# Patient Record
Sex: Female | Born: 1980
Health system: Southern US, Community
[De-identification: ages and names within clinical notes are randomized; demographics above are authoritative.]

## PROBLEM LIST (undated history)

## (undated) DIAGNOSIS — K219 Gastro-esophageal reflux disease without esophagitis: Secondary | ICD-10-CM

## (undated) DIAGNOSIS — F988 Other specified behavioral and emotional disorders with onset usually occurring in childhood and adolescence: Secondary | ICD-10-CM

## (undated) HISTORY — DX: Gastro-esophageal reflux disease without esophagitis: K21.9

## (undated) HISTORY — DX: Other specified behavioral and emotional disorders with onset usually occurring in childhood and adolescence: F98.8

---
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
## 1999-09-25 ENCOUNTER — Encounter: Payer: Self-pay | Admitting: Family Medicine

## 1999-09-25 ENCOUNTER — Ambulatory Visit (HOSPITAL_COMMUNITY): Admission: RE | Admit: 1999-09-25 | Discharge: 1999-09-25 | Payer: Self-pay | Admitting: Family Medicine

## 2000-01-15 ENCOUNTER — Other Ambulatory Visit: Admission: RE | Admit: 2000-01-15 | Discharge: 2000-01-15 | Payer: Self-pay | Admitting: Gastroenterology

## 2000-01-15 ENCOUNTER — Encounter (INDEPENDENT_AMBULATORY_CARE_PROVIDER_SITE_OTHER): Payer: Self-pay | Admitting: Specialist

## 2001-11-27 ENCOUNTER — Encounter: Payer: Self-pay | Admitting: Emergency Medicine

## 2001-11-27 ENCOUNTER — Emergency Department (HOSPITAL_COMMUNITY): Admission: EM | Admit: 2001-11-27 | Discharge: 2001-11-27 | Payer: Self-pay | Admitting: Emergency Medicine

## 2004-03-04 ENCOUNTER — Ambulatory Visit: Payer: Self-pay | Admitting: Family Medicine

## 2004-10-26 ENCOUNTER — Ambulatory Visit: Payer: Self-pay | Admitting: Family Medicine

## 2005-05-25 ENCOUNTER — Ambulatory Visit: Payer: Self-pay | Admitting: Family Medicine

## 2006-02-18 ENCOUNTER — Ambulatory Visit: Payer: Self-pay | Admitting: Family Medicine

## 2006-07-15 DIAGNOSIS — F988 Other specified behavioral and emotional disorders with onset usually occurring in childhood and adolescence: Secondary | ICD-10-CM | POA: Insufficient documentation

## 2006-08-02 ENCOUNTER — Encounter: Payer: Self-pay | Admitting: Family Medicine

## 2006-11-23 ENCOUNTER — Telehealth (INDEPENDENT_AMBULATORY_CARE_PROVIDER_SITE_OTHER): Payer: Self-pay | Admitting: *Deleted

## 2006-12-16 ENCOUNTER — Ambulatory Visit: Payer: Self-pay | Admitting: Family Medicine

## 2006-12-16 DIAGNOSIS — J029 Acute pharyngitis, unspecified: Secondary | ICD-10-CM | POA: Insufficient documentation

## 2006-12-16 LAB — CONVERTED CEMR LAB: Rapid Strep: NEGATIVE

## 2006-12-20 ENCOUNTER — Telehealth (INDEPENDENT_AMBULATORY_CARE_PROVIDER_SITE_OTHER): Payer: Self-pay | Admitting: *Deleted

## 2007-02-10 ENCOUNTER — Telehealth (INDEPENDENT_AMBULATORY_CARE_PROVIDER_SITE_OTHER): Payer: Self-pay | Admitting: *Deleted

## 2007-03-27 ENCOUNTER — Telehealth (INDEPENDENT_AMBULATORY_CARE_PROVIDER_SITE_OTHER): Payer: Self-pay | Admitting: *Deleted

## 2007-04-13 ENCOUNTER — Telehealth (INDEPENDENT_AMBULATORY_CARE_PROVIDER_SITE_OTHER): Payer: Self-pay | Admitting: *Deleted

## 2007-04-13 ENCOUNTER — Encounter: Payer: Self-pay | Admitting: Family Medicine

## 2007-05-29 ENCOUNTER — Encounter: Payer: Self-pay | Admitting: Family Medicine

## 2007-05-29 ENCOUNTER — Telehealth (INDEPENDENT_AMBULATORY_CARE_PROVIDER_SITE_OTHER): Payer: Self-pay | Admitting: *Deleted

## 2007-11-15 ENCOUNTER — Telehealth (INDEPENDENT_AMBULATORY_CARE_PROVIDER_SITE_OTHER): Payer: Self-pay | Admitting: *Deleted

## 2007-12-29 ENCOUNTER — Ambulatory Visit: Payer: Self-pay | Admitting: Family Medicine

## 2007-12-29 DIAGNOSIS — K219 Gastro-esophageal reflux disease without esophagitis: Secondary | ICD-10-CM

## 2008-05-07 ENCOUNTER — Telehealth (INDEPENDENT_AMBULATORY_CARE_PROVIDER_SITE_OTHER): Payer: Self-pay | Admitting: *Deleted

## 2008-06-27 ENCOUNTER — Telehealth (INDEPENDENT_AMBULATORY_CARE_PROVIDER_SITE_OTHER): Payer: Self-pay | Admitting: *Deleted

## 2008-06-28 ENCOUNTER — Encounter: Payer: Self-pay | Admitting: Family Medicine

## 2009-02-05 ENCOUNTER — Telehealth: Payer: Self-pay | Admitting: Family Medicine

## 2010-07-01 ENCOUNTER — Encounter: Payer: Self-pay | Admitting: Family Medicine

## 2010-07-02 ENCOUNTER — Ambulatory Visit (INDEPENDENT_AMBULATORY_CARE_PROVIDER_SITE_OTHER): Payer: PRIVATE HEALTH INSURANCE | Admitting: Family Medicine

## 2010-07-02 ENCOUNTER — Encounter: Payer: Self-pay | Admitting: Family Medicine

## 2010-07-02 VITALS — BP 102/60 | Temp 99.1°F | Wt 139.4 lb

## 2010-07-02 DIAGNOSIS — L089 Local infection of the skin and subcutaneous tissue, unspecified: Secondary | ICD-10-CM

## 2010-07-02 DIAGNOSIS — R238 Other skin changes: Secondary | ICD-10-CM

## 2010-07-02 DIAGNOSIS — F988 Other specified behavioral and emotional disorders with onset usually occurring in childhood and adolescence: Secondary | ICD-10-CM

## 2010-07-02 DIAGNOSIS — L988 Other specified disorders of the skin and subcutaneous tissue: Secondary | ICD-10-CM

## 2010-07-02 DIAGNOSIS — K219 Gastro-esophageal reflux disease without esophagitis: Secondary | ICD-10-CM

## 2010-07-02 MED ORDER — METHYLPHENIDATE HCL 10 MG PO TABS: 10.0000 mg | ORAL_TABLET | Freq: Two times a day (BID) | ORAL | Status: DC

## 2010-07-02 MED ORDER — MUPIROCIN CALCIUM 2 % NA OINT: TOPICAL_OINTMENT | Freq: Two times a day (BID) | NASAL | Status: AC

## 2010-07-02 MED ORDER — PANTOPRAZOLE SODIUM 40 MG PO TBEC: 40.0000 mg | DELAYED_RELEASE_TABLET | Freq: Every day | ORAL | Status: DC

## 2010-07-02 MED ORDER — MUPIROCIN CALCIUM 2 % NA OINT: TOPICAL_OINTMENT | Freq: Two times a day (BID) | NASAL | Status: DC

## 2010-07-02 NOTE — Assessment & Plan Note (Signed)
Refill ritalin rto 6 months

## 2010-07-02 NOTE — Assessment & Plan Note (Signed)
bactroban nasal Cultures sent

## 2010-07-02 NOTE — Progress Notes (Signed)
  Subjective:    Patient ID: Christina Villarreal, female    DOB: Dec 13, 1980, 30 y.o.   MRN: [id number]  HPI Pt here for refill on meds and c/o sore in L nostril that has been there a while. No other complaints.    Review of Systems As above    Objective:   Physical Exam  Constitutional: She is oriented to person, place, and time. She appears well-developed and well-nourished.  HENT:  Nose:    Cardiovascular: Normal rate, regular rhythm and normal heart sounds.   Pulmonary/Chest: Effort normal and breath sounds normal. No respiratory distress. She has no wheezes.  Neurological: She is alert and oriented to person, place, and time.  Skin: Skin is warm and dry.          Assessment & Plan:

## 2010-07-02 NOTE — Assessment & Plan Note (Signed)
Refill protonix 

## 2010-07-05 LAB — WOUND CULTURE

## 2010-07-08 ENCOUNTER — Telehealth: Payer: Self-pay

## 2010-07-08 MED ORDER — ERYTHROMYCIN BASE 500 MG PO TABS: 500.0000 mg | ORAL_TABLET | Freq: Two times a day (BID) | ORAL | Status: AC

## 2010-07-08 NOTE — Telephone Encounter (Signed)
Message copied by Almeta Monas on Wed Jul 08, 2010  2:30 PM ------      Message from: Loreen Freud      Created: Mon Jul 06, 2010  8:34 AM       + staph---not [place]----  Take erythromycin 500 mg bid for 10 days

## 2010-07-08 NOTE — Telephone Encounter (Signed)
Left message to call back     KP 

## 2010-07-08 NOTE — Telephone Encounter (Signed)
Spoke with patient and made her aware of results and she voiced understanding... Rx sent [facility] [address]    KP

## 2010-07-09 ENCOUNTER — Telehealth: Payer: Self-pay | Admitting: *Deleted

## 2010-07-09 NOTE — Telephone Encounter (Signed)
Pt states that she was Dx with staph infection in her nose on yesterday. Pt now c/o upset stomach x1week that she has now increased since starting antibiotic.  Pt would like to know if this could be related to staph infection that she was Dx with.  Discuss with patient, advise Pt to make sure med is taking with food not on empty stomach if symptoms progress advise Pt to call office for OV Pt ok.

## 2010-08-19 ENCOUNTER — Other Ambulatory Visit: Payer: Self-pay | Admitting: *Deleted

## 2010-08-19 MED ORDER — METHYLPHENIDATE HCL 10 MG PO TABS: 10.0000 mg | ORAL_TABLET | Freq: Two times a day (BID) | ORAL | Status: DC

## 2010-08-19 NOTE — Telephone Encounter (Signed)
Left message to call office

## 2010-08-19 NOTE — Telephone Encounter (Signed)
Pt aware Rx ready and that they may not be filled until date indicated and we will not replace lost Rx.

## 2010-09-15 ENCOUNTER — Ambulatory Visit (INDEPENDENT_AMBULATORY_CARE_PROVIDER_SITE_OTHER): Payer: PRIVATE HEALTH INSURANCE | Admitting: Family Medicine

## 2010-09-15 ENCOUNTER — Encounter: Payer: Self-pay | Admitting: Family Medicine

## 2010-09-15 VITALS — BP 112/60 | HR 125 | Temp 100.2°F | Wt 141.8 lb

## 2010-09-15 DIAGNOSIS — J029 Acute pharyngitis, unspecified: Secondary | ICD-10-CM

## 2010-09-15 MED ORDER — BECLOMETHASONE DIPROPIONATE 80 MCG/ACT NA AERS: 2.0000 | INHALATION_SPRAY | NASAL | Status: DC

## 2010-09-15 MED ORDER — AZELASTINE HCL 0.15 % NA SOLN: NASAL | Status: DC

## 2010-09-15 MED ORDER — AMOXICILLIN 875 MG PO TABS: 875.0000 mg | ORAL_TABLET | Freq: Two times a day (BID) | ORAL | Status: AC

## 2010-09-15 NOTE — Patient Instructions (Signed)
Sore Throat   Sore throats may be caused by bacteria and viruses. They may also be caused by:   Ø Smoking.   Ø Pollution.   Ø Allergies.   If a sore throat is due to Strep infection (a bacterial infection), you may need:   Ø A throat swab and/or   Ø A culture test to verify the strep infection.   You will need one of these:   Ø An antibiotic shot.   Ø Oral medicine for a full 10 days to cure it.   Strep infection is very contagious. A doctor should check any close contacts who have a sore throat or fever. A sore throat caused by a virus infection will usually last only 3-4 days. Antibiotics will not treat a viral sore throat.   Infectious mononucleosis (a viral disease), however, can cause a sore throat that last for up to 3 weeks. Mononucleosis can be diagnosed with blood tests. You must have been sick for at least 1 week for the test to give accurate results.   HOME CARE INSTRUCTIONS   Ø To treat a sore throat, take mild pain medicine.   Ø Increase your fluids.   Ø Eat a soft diet.   Ø Do not smoke.   Ø Gargling with warm water or salt water (1 tsp. salt in 8 oz. water) can be helpful.   Ø Try throat sprays or lozenges or sucking on hard candy will also ease the symptoms.   Call your doctor if your sore throat lasts longer than 1 week.   SEEK IMMEDIATE MEDICAL CARE IF YOU HAVE:   Ø Breathing difficulty.   Ø Increased swelling in the throat.   Ø Pain so severe you are unable to swallow fluids or your saliva.   Ø A severe headache, high fever, vomiting, or red rash.   Document Released: 03/25/2004 Document Re-Released: 05/14/2008   ExitCare® Patient Information ©2011 [employer].

## 2010-09-15 NOTE — Progress Notes (Signed)
  Subjective:     Christina Villarreal is a 30 y.o. female who presents for evaluation of symptoms of a URI. Symptoms include congestion, facial pain, low grade fever, nasal congestion and sore throat. Onset of symptoms was 1 day ago, and has been gradually worsening since that time. Treatment to date: none.  The following portions of the patient's history were reviewed and updated as appropriate: allergies, current medications, past family history, past medical history, past social history, past surgical history and problem list.  Review of Systems Pertinent items are noted in HPI.   Objective:    BP 112/60  Pulse 125  Temp(Src) 100.2 F (37.9 C) (Oral)  Wt 141 lb 12.8 oz (64.32 kg)  SpO2 96% General appearance: alert, cooperative, appears stated age and no distress Head: Normocephalic, without obvious abnormality, atraumatic Ears: normal TM's and external ear canals both ears Nose: Nares normal. Septum midline. Mucosa normal. No drainage or sinus tenderness., sinus tenderness right Throat: abnormal findings: mild oropharyngeal erythema Neck: no adenopathy, no carotid bruit, no JVD, supple, symmetrical, trachea midline and thyroid not enlarged, symmetric, no tenderness/mass/nodules Lungs: clear to auscultation bilaterally Heart: regular rate and rhythm, S1, S2 normal, no murmur, click, rub or gallop   Assessment:    pharyngitis   Plan:    Discussed diagnosis and treatment of URI. Suggested symptomatic OTC remedies. Nasal saline spray for congestion. Follow up as needed.

## 2010-09-17 LAB — CULTURE, GROUP A STREP: Organism ID, Bacteria: NORMAL

## 2011-03-22 ENCOUNTER — Encounter: Payer: Self-pay | Admitting: Family

## 2011-03-22 ENCOUNTER — Ambulatory Visit (INDEPENDENT_AMBULATORY_CARE_PROVIDER_SITE_OTHER): Payer: PRIVATE HEALTH INSURANCE | Admitting: Family

## 2011-03-22 DIAGNOSIS — J329 Chronic sinusitis, unspecified: Secondary | ICD-10-CM | POA: Insufficient documentation

## 2011-03-22 MED ORDER — AMOXICILLIN-POT CLAVULANATE 875-125 MG PO TABS: 1.0000 | ORAL_TABLET | Freq: Two times a day (BID) | ORAL | Status: DC

## 2011-03-22 NOTE — Patient Instructions (Signed)

## 2011-03-22 NOTE — Progress Notes (Signed)
  Subjective:    Patient ID: Christina Villarreal, female    DOB: 03-05-1980, 31 y.o.   MRN: [id number]  HPI  Ms.  Villarreal is a 31 yr old female who presents today with with chief complaint of sinus congestion and pressure. Symptoms started with cold symptoms about 3 weeks ago. Now has developed tooth pain, facial pain/pressure, poor taste and chills.  Son had strep throat.  Symptoms are worsening.    Review of Systems See HPI  Past Medical History  Diagnosis Date  . ADD (attention deficit disorder)   . GERD (gastroesophageal reflux disease)     History   Social History  . Marital Status: Single    Spouse Name: N/A    Number of Children: N/A  . Years of Education: N/A   Occupational History  . Not on file.   Social History Main Topics  . Smoking status: Never Smoker   . Smokeless tobacco: Never Used  . Alcohol Use: No  . Drug Use: No  . Sexually Active: Not on file   Other Topics Concern  . Not on file   Social History Narrative  . No narrative on file    No past surgical history on file.  No family history on file.  No Known Allergies  Current Outpatient Prescriptions on File Prior to Visit  Medication Sig Dispense Refill  . Beclomethasone Dipropionate (QNASL) 80 MCG/ACT AERS Place 2 sprays into the nose 1 day or 1 dose.      . methylphenidate (RITALIN) 10 MG tablet Take 1 tablet (10 mg total) by mouth 2 (two) times daily.  60 tablet  0  . pantoprazole (PROTONIX) 40 MG tablet Take 1 tablet (40 mg total) by mouth daily.  30 tablet  5  . valACYclovir (VALTREX) 500 MG tablet Take 500 mg by mouth daily.         BP 112/72  Pulse 96  Temp(Src) 98 F (36.7 C) (Oral)  Resp 16  Wt 146 lb 1.9 oz (66.28 kg)  LMP 03/22/2011       Objective:   Physical Exam  Constitutional: She appears well-developed and well-nourished. No distress.  HENT:  Head: Normocephalic and atraumatic.  Right Ear: Tympanic membrane and ear canal normal.  Left Ear: Tympanic membrane and ear  canal normal.  Mouth/Throat: No oropharyngeal exudate, posterior oropharyngeal edema or posterior oropharyngeal erythema.       + maxillary and sinus tenderness to palpation.  Cardiovascular: Normal rate and regular rhythm.   No murmur heard. Pulmonary/Chest: Effort normal and breath sounds normal. No respiratory distress. She has no wheezes. She has no rales. She exhibits no tenderness.  Musculoskeletal: She exhibits no edema.  Psychiatric: She has a normal mood and affect. Her behavior is normal. Judgment and thought content normal.          Assessment & Plan:

## 2011-03-22 NOTE — Assessment & Plan Note (Signed)
Will treat with Augmentin.

## 2011-03-25 ENCOUNTER — Telehealth: Payer: Self-pay | Admitting: *Deleted

## 2011-03-25 MED ORDER — LEVOFLOXACIN 750 MG PO TABS: 750.0000 mg | ORAL_TABLET | Freq: Every day | ORAL | Status: AC

## 2011-03-25 NOTE — Telephone Encounter (Signed)
Pt called stating she has been taking Augmentin since Monday but has not had any improvement in her symptoms. Face still hurts, lot of head congestion and now having diarrhea from the abx. Pt states she does not feel worse but does not feel any better. Wants to know if she should try a different antibiotic? If so, [facility].  Please advise.

## 2011-03-25 NOTE — Telephone Encounter (Signed)
Notified pt. 

## 2011-03-25 NOTE — Telephone Encounter (Signed)
Stop Augmentin, start Levaquin.  Follow up in office tomorrow if no improvement.

## 2011-04-01 ENCOUNTER — Telehealth: Payer: Self-pay | Admitting: Family Medicine

## 2011-04-01 MED ORDER — METHYLPHENIDATE HCL 10 MG PO TABS: 10.0000 mg | ORAL_TABLET | Freq: Two times a day (BID) | ORAL | Status: DC

## 2011-04-01 NOTE — Telephone Encounter (Signed)
yes

## 2011-04-01 NOTE — Telephone Encounter (Signed)
Discussed with patient and made her aware that she is due for and exam and she will need OV. She stated she will call back to schedule. I made her aware I will only give a 30 day supply until seen, and she voiced understanding. Dr.Lowne made aware and she agreed.    KP

## 2011-04-01 NOTE — Telephone Encounter (Signed)
Patient states that she is returning phone call. Best# [phone] and per patient it is okay to leave detailed message.

## 2011-04-01 NOTE — Telephone Encounter (Signed)
Patient would like a new prescription for Ritalin. She would like to know if she could have 3 prescriptions so she will not have to come by office every month.

## 2011-04-20 ENCOUNTER — Ambulatory Visit (INDEPENDENT_AMBULATORY_CARE_PROVIDER_SITE_OTHER): Payer: PRIVATE HEALTH INSURANCE | Admitting: Family Medicine

## 2011-04-20 ENCOUNTER — Encounter: Payer: Self-pay | Admitting: Family Medicine

## 2011-04-20 DIAGNOSIS — Z0000 Encounter for general adult medical examination without abnormal findings: Secondary | ICD-10-CM | POA: Insufficient documentation

## 2011-04-20 NOTE — Progress Notes (Signed)
Addended by: Derry Lory A on: 04/20/2011 05:29 PM   Modules accepted: Orders

## 2011-04-20 NOTE — Assessment & Plan Note (Signed)
Pt's PE WNL.  UTD on GYN.  TB test given for employment form.  Anticipatory guidance provided.

## 2011-04-20 NOTE — Patient Instructions (Signed)
Schedule a nurse visit to read your TB test on Thursday or Friday You look great!  Keep up the great work! Call with any questions or concerns Good luck going back!!

## 2011-04-20 NOTE — Progress Notes (Signed)
  Subjective:    Patient ID: Christina Villarreal, female    DOB: Aug 26, 1980, 31 y.o.   MRN: [id number]  HPI CPE- no concerns today.  UTD on GYN ([facility])   Review of Systems Patient reports no vision/ hearing changes, adenopathy,fever, weight change,  persistant/recurrent hoarseness , swallowing issues, chest pain, palpitations, edema, persistant/recurrent cough, hemoptysis, dyspnea (rest/exertional/paroxysmal nocturnal), gastrointestinal bleeding (melena, rectal bleeding), abdominal pain, significant heartburn, bowel changes, GU symptoms (dysuria, hematuria, incontinence), Gyn symptoms (abnormal  bleeding, pain),  syncope, focal weakness, memory loss, numbness & tingling, skin/hair/nail changes, abnormal bruising or bleeding, anxiety, or depression.     Objective:   Physical Exam General Appearance:    Alert, cooperative, no distress, appears stated age  Head:    Normocephalic, without obvious abnormality, atraumatic  Eyes:    PERRL, conjunctiva/corneas clear, EOM's intact, fundi    benign, both eyes  Ears:    Normal TM's and external ear canals, both ears  Nose:   Nares normal, septum midline, mucosa normal, no drainage    or sinus tenderness  Throat:   Lips, mucosa, and tongue normal; teeth and gums normal  Neck:   Supple, symmetrical, trachea midline, no adenopathy;    Thyroid: no enlargement/tenderness/nodules  Back:     Symmetric, no curvature, ROM normal, no CVA tenderness  Lungs:     Clear to auscultation bilaterally, respirations unlabored  Chest Wall:    No tenderness or deformity   Heart:    Regular rate and rhythm, S1 and S2 normal, no murmur, rub   or gallop  Breast Exam:    Deferred to GYN  Abdomen:     Soft, non-tender, bowel sounds active all four quadrants,    no masses, no organomegaly  Genitalia:    Deferred to GYN  Rectal:    Extremities:   Extremities normal, atraumatic, no cyanosis or edema  Pulses:   2+ and symmetric all extremities  Skin:   Skin color, texture,  turgor normal, no rashes or lesions  Lymph nodes:   Cervical, supraclavicular, and axillary nodes normal  Neurologic:   CNII-XII intact, normal strength, sensation and reflexes    throughout          Assessment & Plan:

## 2011-04-22 ENCOUNTER — Ambulatory Visit: Payer: PRIVATE HEALTH INSURANCE

## 2011-04-22 LAB — TB SKIN TEST
Induration: 0
TB Skin Test: NEGATIVE mm

## 2013-03-19 ENCOUNTER — Telehealth: Payer: Self-pay | Admitting: *Deleted

## 2013-03-19 NOTE — Telephone Encounter (Signed)
Patient has not been seen in the office since February 2013, based on this we are unable to call in Tamiflu. Patient made aware. [name], RN     Triage Call Report Triage Record Num: [id number] Operator: Baldomero LamyKimberly Barnwell Patient Name: Christina Villarreal Call Date & Time: 03/17/2013 5:01:55PM Patient Phone: [phone] PCP: [name] Patient Gender: Female PCP Fax : [phone] Patient DOB: 11/23/1980 Practice Name: [facility] Reason for Call: Caller: Christina Villarreal/Patient; PCP: [name], Yvonne R.; CB#: [phone]; Mom calling requesting Tamiflu. Both children tested positive today. Pt has stuffy nose that started on 03/16/13. Afebrile. Has not treated. Offered triage but pt declined if Tamiflu could not be called in. Care advice given. Protocol(s) Used: Medication Questions - Adult Recommended Outcome per Protocol: Provided Health Information Reason for Outcome: Caller has medication question(s) that was answered with available resources Care Advice: ~

## 2013-03-19 NOTE — Telephone Encounter (Signed)
[facility] Triage Call Report Triage Record Num: [id number] Operator: Baldomero LamyKimberly Barnwell Patient Name: Christina Villarreal Call Date & Time: 03/17/2013 5:01:55PM Patient Phone: [phone] PCP: [name] Patient Gender: Female PCP Fax : [phone] Patient DOB: 10/16/1980 Practice Name: [facility] [place] Reason for Call: Caller: [name]/Patient; PCP: [name], Yvonne R.; CB#: [phone]; Mom calling requesting Tamiflu. Both children tested positive today. Pt has stuffy nose that started on 03/16/13. Afebrile. Has not treated. Offered triage but pt declined if Tamiflu could not be called in. Care advice given. Protocol(s) Used: Medication Questions - Adult Recommended Outcome per Protocol: Provided Health Information Reason for Outcome: Caller has medication question(s) that was answered with available resources Care Advice: ~ 01/

## 2013-06-07 ENCOUNTER — Ambulatory Visit (INDEPENDENT_AMBULATORY_CARE_PROVIDER_SITE_OTHER): Payer: BC Managed Care – PPO | Admitting: Family Medicine

## 2013-06-07 ENCOUNTER — Encounter: Payer: Self-pay | Admitting: Family Medicine

## 2013-06-07 VITALS — BP 108/64 | HR 80 | Temp 98.3°F | Wt 116.0 lb

## 2013-06-07 DIAGNOSIS — F411 Generalized anxiety disorder: Secondary | ICD-10-CM

## 2013-06-07 DIAGNOSIS — F988 Other specified behavioral and emotional disorders with onset usually occurring in childhood and adolescence: Secondary | ICD-10-CM

## 2013-06-07 MED ORDER — METHYLPHENIDATE HCL 10 MG PO TABS: 10.0000 mg | ORAL_TABLET | Freq: Two times a day (BID) | ORAL | Status: DC

## 2013-06-07 MED ORDER — ESCITALOPRAM OXALATE 10 MG PO TABS: 10.0000 mg | ORAL_TABLET | Freq: Every day | ORAL | Status: DC

## 2013-06-07 NOTE — Patient Instructions (Signed)
Attention Deficit Hyperactivity Disorder Attention deficit hyperactivity disorder (ADHD) is a problem with behavior issues based on the way the brain functions (neurobehavioral disorder). It is a common reason for behavior and academic problems in school. SYMPTOMS  There are 3 types of ADHD. The 3 types and some of the symptoms include:  Inattentive  Gets bored or distracted easily.  Loses or forgets things. Forgets to hand in homework.  Has trouble organizing or completing tasks.  Difficulty staying on task.  An inability to organize daily tasks and school work.  Leaving projects, chores, or homework unfinished.  Trouble paying attention or responding to details. Careless mistakes.  Difficulty following directions. Often seems like is not listening.  Dislikes activities that require sustained attention (like chores or homework).  Hyperactive-impulsive  Feels like it is impossible to sit still or stay in a seat. Fidgeting with hands and feet.  Trouble waiting turn.  Talking too much or out of turn. Interruptive.  Speaks or acts impulsively.  Aggressive, disruptive behavior.  Constantly busy or on the go, noisy.  Often leaves seat when they are expected to remain seated.  Often runs or climbs where it is not appropriate, or feels very restless.  Combined  Has symptoms of both of the above. Often children with ADHD feel discouraged about themselves and with school. They often perform well below their abilities in school. As children get older, the excess motor activities can calm down, but the problems with paying attention and staying organized persist. Most children do not outgrow ADHD but with good treatment can learn to cope with the symptoms. DIAGNOSIS  When ADHD is suspected, the diagnosis should be made by professionals trained in ADHD. This professional will collect information about the individual suspected of having ADHD. Information must be collected from  various settings where the person lives, works, or attends school.  Diagnosis will include:  Confirming symptoms began in childhood.  Ruling out other reasons for the child's behavior.  The health care providers will check with the child's school and check their medical records.  They will talk to teachers and parents.  Behavior rating scales for the child will be filled out by those dealing with the child on a daily basis. A diagnosis is made only after all information has been considered. TREATMENT  Treatment usually includes behavioral treatment, tutoring or extra support in school, and stimulant medicines. Because of the way a person's brain works with ADHD, these medicines decrease impulsivity and hyperactivity and increase attention. This is different than how they would work in a person who does not have ADHD. Other medicines used include antidepressants and certain blood pressure medicines. Most experts agree that treatment for ADHD should address all aspects of the person's functioning. Along with medicines, treatment should include structured classroom management at school. Parents should reward good behavior, provide constant discipline, and limit-setting. Tutoring should be available for the child as needed. ADHD is a life-long condition. If untreated, the disorder can have long-term serious effects into adolescence and adulthood. HOME CARE INSTRUCTIONS   Often with ADHD there is a lot of frustration among family members dealing with the condition. Blame and anger are also feelings that are common. In many cases, because the problem affects the family as a whole, the entire family may need help. A therapist can help the family find better ways to handle the disruptive behaviors of the person with ADHD and promote change. If the person with ADHD is young, most of the therapist's work   is with the parents. Parents will learn techniques for coping with and improving their child's  behavior. Sometimes only the child with the ADHD needs counseling. Your health care providers can help you make these decisions.  Children with ADHD may need help learning how to organize. Some helpful tips include:  Keep routines the same every day from wake-up time to bedtime. Schedule all activities, including homework and playtime. Keep the schedule in a place where the person with ADHD will often see it. Mark schedule changes as far in advance as possible.  Schedule outdoor and indoor recreation.  Have a place for everything and keep everything in its place. This includes clothing, backpacks, and school supplies.  Encourage writing down assignments and bringing home needed books. Work with your child's teachers for assistance in organizing school work.  Offer your child a well-balanced diet. Breakfast that includes a balance of whole grains, protein and, fruits or vegetables is especially important for school performance. Children should avoid drinks with caffeine including:  Soft drinks.  Coffee.  Tea.  However, some older children (adolescents) may find these drinks helpful in improving their attention. Because it can also be common for adolescents with ADHD to become addicted to caffeine, talk with your health care provider about what is a safe amount of caffeine intake for your child.  Children with ADHD need consistent rules that they can understand and follow. If rules are followed, give small rewards. Children with ADHD often receive, and expect, criticism. Look for good behavior and praise it. Set realistic goals. Give clear instructions. Look for activities that can foster success and self-esteem. Make time for pleasant activities with your child. Give lots of affection.  Parents are their children's greatest advocates. Learn as much as possible about ADHD. This helps you become a stronger and better advocate for your child. It also helps you educate your child's teachers and  instructors if they feel inadequate in these areas. Parent support groups are often helpful. A national group with local chapters is called Children and Adults with Attention Deficit Hyperactivity Disorder (CHADD). SEEK MEDICAL CARE IF:  Your child has repeated muscle twitches, cough or speech outbursts.  Your child has sleep problems.  Your child has a marked loss of appetite.  Your child develops depression.  Your child has new or worsening behavioral problems.  Your child develops dizziness.  Your child has a racing heart.  Your child has stomach pains.  Your child develops headaches. SEEK IMMEDIATE MEDICAL CARE IF:  Your child has been diagnosed with depression or anxiety and the symptoms seem to be getting worse.  Your child has been depressed and suddenly appears to have increased energy or motivation.  You are worried that your child is having a bad reaction to a medication he or she is taking for ADHD. Document Released: 02/05/2002 Document Revised: 12/06/2012 Document Reviewed: 10/23/2012 ExitCare Patient Information 2014 [employer].  

## 2013-06-07 NOTE — Progress Notes (Signed)
Patient ID: Christina Villarreal, female   DOB: 08/04/1980, 33 y.o.   MRN: [id number]   Subjective:    Patient ID: Christina Lipsmily J Mignogna, female    DOB: 09/22/1980, 33 y.o.   MRN: [id number] HPI Pt here to restart ritalin.  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  She was taking prozac but it made her more anxious.       Past Medical History  Diagnosis Date  . ADD (attention deficit disorder)   . GERD (gastroesophageal reflux disease)    History   Social History  . Marital Status: Single    Spouse Name: N/A    Number of Children: N/A  . Years of Education: N/A   Occupational History  . Not on file.   Social History Main Topics  . Smoking status: Never Smoker   . Smokeless tobacco: Never Used  . Alcohol Use: No  . Drug Use: No  . Sexual Activity: Not on file   Other Topics Concern  . Not on file   Social History Narrative  . No narrative on file   No past surgical history on file. No family history on file.      Objective:     BP 108/64  Pulse 80  Temp(Src) 98.3 F (36.8 C) (Oral)  Wt 116 lb (52.617 kg)  SpO2 98% General appearance: alert, cooperative, appears stated age and no distress Neck: no adenopathy, supple, symmetrical, trachea midline and thyroid not enlarged, symmetric, no tenderness/mass/nodules Lungs: clear to auscultation bilaterally Heart: S1, S2 normal Extremities: extremities normal, atraumatic, no cyanosis or edema   \     Assessment & Plan:  1. Attention deficit disorder without mention of hyperactivity Restart meds-rto 6 months or sooner prn - methylphenidate (RITALIN) 10 MG tablet; Take 1 tablet (10 mg total) by mouth 2 (two) times daily.  Dispense: 60 tablet; Refill: 0 - methylphenidate (RITALIN) 10 MG tablet; Take 1 tablet (10 mg total) by mouth 2 (two) times daily.  Dispense: 60 tablet; Refill: 0 - methylphenidate (RITALIN) 10 MG tablet; Take 1 tablet (10 mg total) by  mouth 2 (two) times daily.  Dispense: 60 tablet; Refill: 0  2. Generalized anxiety disorder Start meds---- recheck 3-6 months or sooner prn  - escitalopram (LEXAPRO) 10 MG tablet; Take 1 tablet (10 mg total) by mouth at bedtime.  Dispense: 30 tablet; Refill: 2

## 2013-06-07 NOTE — Progress Notes (Signed)
Pre visit review using our clinic review tool, if applicable. No additional management support is needed unless otherwise documented below in the visit note. 

## 2013-06-11 ENCOUNTER — Telehealth: Payer: Self-pay | Admitting: *Deleted

## 2013-06-11 NOTE — Telephone Encounter (Signed)
Prior authorization paperwork faxed to [facility]. Awaiting response. [id number]//[id number]

## 2013-06-21 NOTE — Telephone Encounter (Signed)
Patient called to check on her prior authorization for ritalin. Please advise.

## 2013-06-21 NOTE — Telephone Encounter (Signed)
Spoke with patient and made her aware that we are waiting for a  response from [facility]. Patient stated that she would call [facility] to see what the hold up is and call us back with further questions or concerns.

## 2013-06-26 ENCOUNTER — Telehealth: Payer: Self-pay | Admitting: *Deleted

## 2013-06-26 NOTE — Telephone Encounter (Signed)
error 

## 2013-06-26 NOTE — Telephone Encounter (Signed)
Called and spoke with [facility] prior authorization department. Prior authorization was approved effective 06/05/2013 through 06/26/2014. Called and informed patient. JG//CMA

## 2013-06-30 ENCOUNTER — Other Ambulatory Visit: Payer: Self-pay | Admitting: Family Medicine

## 2013-08-22 ENCOUNTER — Telehealth: Payer: Self-pay

## 2013-08-22 NOTE — Telephone Encounter (Signed)
Left message for call back Non identifiable  

## 2013-08-23 ENCOUNTER — Ambulatory Visit (INDEPENDENT_AMBULATORY_CARE_PROVIDER_SITE_OTHER): Payer: BC Managed Care – PPO | Admitting: Family Medicine

## 2013-08-23 ENCOUNTER — Encounter: Payer: Self-pay | Admitting: Family Medicine

## 2013-08-23 VITALS — BP 108/62 | HR 81 | Temp 98.9°F | Ht 64.5 in | Wt 117.4 lb

## 2013-08-23 DIAGNOSIS — F988 Other specified behavioral and emotional disorders with onset usually occurring in childhood and adolescence: Secondary | ICD-10-CM

## 2013-08-23 DIAGNOSIS — Z0000 Encounter for general adult medical examination without abnormal findings: Secondary | ICD-10-CM

## 2013-08-23 MED ORDER — METHYLPHENIDATE HCL 10 MG PO TABS: 10.0000 mg | ORAL_TABLET | Freq: Two times a day (BID) | ORAL | Status: DC

## 2013-08-23 NOTE — Progress Notes (Signed)
Subjective:     Christina Villarreal is a 33 y.o. female and is here for a comprehensive physical exam. The patient reports no problems.  History   Social History  . Marital Status: Single    Spouse Name: N/A    Number of Children: N/A  . Years of Education: N/A   Occupational History  . Not on file.   Social History Main Topics  . Smoking status: Never Smoker   . Smokeless tobacco: Never Used  . Alcohol Use: No  . Drug Use: No  . Sexual Activity: Not on file   Other Topics Concern  . Not on file   Social History Narrative  . No narrative on file   Health Maintenance  Topic Date Due  . Influenza Vaccine  09/29/2013  . Pap Smear  08/26/2015  . Tetanus/tdap  07/16/2022    The following portions of the patient's history were reviewed and updated as appropriate:  She  has a past medical history of ADD (attention deficit disorder) and GERD (gastroesophageal reflux disease). She  does not have any pertinent problems on file. She  has no past surgical history on file. Her family history is not on file. She  reports that she has never smoked. She has never used smokeless tobacco. She reports that she does not drink alcohol or use illicit drugs. She has a current medication list which includes the following prescription(s): escitalopram, methylphenidate, methylphenidate, methylphenidate, nora-be, prenatal vitamin w/fe, fa, and valacyclovir. Current Outpatient Prescriptions on File Prior to Visit  Medication Sig Dispense Refill  . escitalopram (LEXAPRO) 10 MG tablet TAKE 1 TABLET BY MOUTH EVERY NIGHT AT BEDTIME  90 tablet  1  . NORA-BE 0.35 MG tablet Take 1 tablet by mouth daily.      . prenatal vitamin w/FE, FA (PRENATAL 1 + 1) 27-1 MG TABS tablet Take 1 tablet by mouth daily.      . valACYclovir (VALTREX) 500 MG tablet Take 500 mg by mouth daily.        No current facility-administered medications on file prior to visit.   She has No Known Allergies..  Review of Systems Review  of Systems  Constitutional: Negative for activity change, appetite change and fatigue.  HENT: Negative for hearing loss, congestion, tinnitus and ear discharge.  dentist [id number] Eyes: Negative for visual disturbance (see optho q1y -- vision corrected to 20/20 with glasses).  Respiratory: Negative for cough, chest tightness and shortness of breath.   Cardiovascular: Negative for chest pain, palpitations and leg swelling.  Gastrointestinal: Negative for abdominal pain, diarrhea, constipation and abdominal distention.  Genitourinary: Negative for urgency, frequency, decreased urine volume and difficulty urinating.  Musculoskeletal: Negative for back pain, arthralgias and gait problem.  Skin: Negative for color change, pallor and rash.  Neurological: Negative for dizziness, light-headedness, numbness and headaches.  Hematological: Negative for adenopathy. Does not bruise/bleed easily.  Psychiatric/Behavioral: Negative for suicidal ideas, confusion, sleep disturbance, self-injury, dysphoric mood, decreased concentration and agitation.       Objective:    BP 108/62  Pulse 81  Temp(Src) 98.9 F (37.2 C) (Oral)  Ht 5' 4.5" (1.638 m)  Wt 117 lb 6.4 oz (53.252 kg)  BMI 19.85 kg/m2  SpO2 98% General appearance: alert, cooperative, appears stated age and no distress Head: Normocephalic, without obvious abnormality, atraumatic Eyes: conjunctivae/corneas clear. PERRL, EOM's intact. Fundi benign. Ears: normal TM's and external ear canals both ears Nose: Nares normal. Septum midline. Mucosa normal. No drainage or sinus tenderness. Throat: lips, mucosa,  and tongue normal; teeth and gums normal Neck: no adenopathy, no carotid bruit, no JVD, supple, symmetrical, trachea midline and thyroid not enlarged, symmetric, no tenderness/mass/nodules Back: symmetric, no curvature. ROM normal. No CVA tenderness. Lungs: clear to auscultation bilaterally Breasts: gyn Heart: regular rate and rhythm, S1, S2 normal,  no murmur, click, rub or gallop Abdomen: soft, non-tender; bowel sounds normal; no masses,  no organomegaly Pelvic: deferred--gyn Extremities: extremities normal, atraumatic, no cyanosis or edema Pulses: 2+ and symmetric Skin: Skin color, texture, turgor normal. No rashes or lesions Lymph nodes: Cervical, supraclavicular, and axillary nodes normal. Neurologic: Alert and oriented X 3, normal strength and tone. Normal symmetric reflexes. Normal coordination and gait Psych--- no depression, no anxiety      Assessment:    Healthy female exam.       Plan:    ghm utd Check labs See After Visit Summary for Counseling Recommendations   1. Attention deficit disorder without mention of hyperactivity  - methylphenidate (RITALIN) 10 MG tablet; Take 1 tablet (10 mg total) by mouth 2 (two) times daily.  Dispense: 60 tablet; Refill: 0 - methylphenidate (RITALIN) 10 MG tablet; Take 1 tablet (10 mg total) by mouth 2 (two) times daily.  Dispense: 60 tablet; Refill: 0 - methylphenidate (RITALIN) 10 MG tablet; Take 1 tablet (10 mg total) by mouth 2 (two) times daily.  Dispense: 60 tablet; Refill: 0  2. Preventative health care  - Basic metabolic panel; Future - CBC with Differential; Future - Hepatic function panel; Future - Lipid panel; Future - POCT urinalysis dipstick; Future - TSH; Future

## 2013-08-23 NOTE — Patient Instructions (Signed)
Preventive Care for Adults A healthy lifestyle and preventive care can promote health and wellness. Preventive health guidelines for women include the following key practices.  A routine yearly physical is a good way to check with your health care provider about your health and preventive screening. It is a chance to share any concerns and updates on your health and to receive a thorough exam.  Visit your dentist for a routine exam and preventive care every 6 months. Brush your teeth twice a day and floss once a day. Good oral hygiene prevents tooth decay and gum disease.  The frequency of eye exams is based on your age, health, family medical history, use of contact lenses, and other factors. Follow your health care provider's recommendations for frequency of eye exams.  Eat a healthy diet. Foods like vegetables, fruits, whole grains, low-fat dairy products, and lean protein foods contain the nutrients you need without too many calories. Decrease your intake of foods high in solid fats, added sugars, and salt. Eat the right amount of calories for you.Get information about a proper diet from your health care provider, if necessary.  Regular physical exercise is one of the most important things you can do for your health. Most adults should get at least 150 minutes of moderate-intensity exercise (any activity that increases your heart rate and causes you to sweat) each week. In addition, most adults need muscle-strengthening exercises on 2 or more days a week.  Maintain a healthy weight. The body mass index (BMI) is a screening tool to identify possible weight problems. It provides an estimate of body fat based on height and weight. Your health care provider can find your BMI, and can help you achieve or maintain a healthy weight.For adults 20 years and older:  A BMI below 18.5 is considered underweight.  A BMI of 18.5 to 24.9 is normal.  A BMI of 25 to 29.9 is considered overweight.  A BMI of  30 and above is considered obese.  Maintain normal blood lipids and cholesterol levels by exercising and minimizing your intake of saturated fat. Eat a balanced diet with plenty of fruit and vegetables. Blood tests for lipids and cholesterol should begin at age 52 and be repeated every 5 years. If your lipid or cholesterol levels are high, you are over 50, or you are at high risk for heart disease, you may need your cholesterol levels checked more frequently.Ongoing high lipid and cholesterol levels should be treated with medicines if diet and exercise are not working.  If you smoke, find out from your health care provider how to quit. If you do not use tobacco, do not start.  Lung cancer screening is recommended for adults aged 37-80 years who are at high risk for developing lung cancer because of a history of smoking. A yearly low-dose CT scan of the lungs is recommended for people who have at least a 30-pack-year history of smoking and are a current smoker or have quit within the past 15 years. A pack year of smoking is smoking an average of 1 pack of cigarettes a day for 1 year (for example: 1 pack a day for 30 years or 2 packs a day for 15 years). Yearly screening should continue until the smoker has stopped smoking for at least 15 years. Yearly screening should be stopped for people who develop a health problem that would prevent them from having lung cancer treatment.  If you are pregnant, do not drink alcohol. If you are breastfeeding,  be very cautious about drinking alcohol. If you are not pregnant and choose to drink alcohol, do not have more than 1 drink per day. One drink is considered to be 12 ounces (355 mL) of beer, 5 ounces (148 mL) of wine, or 1.5 ounces (44 mL) of liquor.  Avoid use of street drugs. Do not share needles with anyone. Ask for help if you need support or instructions about stopping the use of drugs.  High blood pressure causes heart disease and increases the risk of  stroke. Your blood pressure should be checked at least every 1 to 2 years. Ongoing high blood pressure should be treated with medicines if weight loss and exercise do not work.  If you are 3-86 years old, ask your health care provider if you should take aspirin to prevent strokes.  Diabetes screening involves taking a blood sample to check your fasting blood sugar level. This should be done once every 3 years, after age 67, if you are within normal weight and without risk factors for diabetes. Testing should be considered at a younger age or be carried out more frequently if you are overweight and have at least 1 risk factor for diabetes.  Breast cancer screening is essential preventive care for women. You should practice "breast self-awareness." This means understanding the normal appearance and feel of your breasts and may include breast self-examination. Any changes detected, no matter how small, should be reported to a health care provider. Women in their 8s and 30s should have a clinical breast exam (CBE) by a health care provider as part of a regular health exam every 1 to 3 years. After age 70, women should have a CBE every year. Starting at age 25, women should consider having a mammogram (breast X-ray test) every year. Women who have a family history of breast cancer should talk to their health care provider about genetic screening. Women at a high risk of breast cancer should talk to their health care providers about having an MRI and a mammogram every year.  Breast cancer gene (BRCA)-related cancer risk assessment is recommended for women who have family members with BRCA-related cancers. BRCA-related cancers include breast, ovarian, tubal, and peritoneal cancers. Having family members with these cancers may be associated with an increased risk for harmful changes (mutations) in the breast cancer genes BRCA1 and BRCA2. Results of the assessment will determine the need for genetic counseling and  BRCA1 and BRCA2 testing.  Routine pelvic exams to screen for cancer are no longer recommended for nonpregnant women who are considered low risk for cancer of the pelvic organs (ovaries, uterus, and vagina) and who do not have symptoms. Ask your health care provider if a screening pelvic exam is right for you.  If you have had past treatment for cervical cancer or a condition that could lead to cancer, you need Pap tests and screening for cancer for at least 20 years after your treatment. If Pap tests have been discontinued, your risk factors (such as having a new sexual partner) need to be reassessed to determine if screening should be resumed. Some women have medical problems that increase the chance of getting cervical cancer. In these cases, your health care provider may recommend more frequent screening and Pap tests.  The HPV test is an additional test that may be used for cervical cancer screening. The HPV test looks for the virus that can cause the cell changes on the cervix. The cells collected during the Pap test can be  tested for HPV. The HPV test could be used to screen women aged 47 years and older, and should be used in women of any age who have unclear Pap test results. After the age of 36, women should have HPV testing at the same frequency as a Pap test.  Colorectal cancer can be detected and often prevented. Most routine colorectal cancer screening begins at the age of 38 years and continues through age 58 years. However, your health care provider may recommend screening at an earlier age if you have risk factors for colon cancer. On a yearly basis, your health care provider may provide home test kits to check for hidden blood in the stool. Use of a small camera at the end of a tube, to directly examine the colon (sigmoidoscopy or colonoscopy), can detect the earliest forms of colorectal cancer. Talk to your health care provider about this at age 64, when routine screening begins. Direct  exam of the colon should be repeated every 5-10 years through age 21 years, unless early forms of pre-cancerous polyps or small growths are found.  People who are at an increased risk for hepatitis B should be screened for this virus. You are considered at high risk for hepatitis B if:  You were born in a country where hepatitis B occurs often. Talk with your health care provider about which countries are considered high risk.  Your parents were born in a high-risk country and you have not received a shot to protect against hepatitis B (hepatitis B vaccine).  You have HIV or AIDS.  You use needles to inject street drugs.  You live with, or have sex with, someone who has Hepatitis B.  You get hemodialysis treatment.  You take certain medicines for conditions like cancer, organ transplantation, and autoimmune conditions.  Hepatitis C blood testing is recommended for all people born from [birth year] through 1965 and any individual with known risks for hepatitis C.  Practice safe sex. Use condoms and avoid high-risk sexual practices to reduce the spread of sexually transmitted infections (STIs). STIs include gonorrhea, chlamydia, syphilis, trichomonas, herpes, HPV, and human immunodeficiency virus (HIV). Herpes, HIV, and HPV are viral illnesses that have no cure. They can result in disability, cancer, and death.  You should be screened for sexually transmitted illnesses (STIs) including gonorrhea and chlamydia if:  You are sexually active and are younger than 24 years.  You are older than 24 years and your health care provider tells you that you are at risk for this type of infection.  Your sexual activity has changed since you were last screened and you are at an increased risk for chlamydia or gonorrhea. Ask your health care provider if you are at risk.  If you are at risk of being infected with HIV, it is recommended that you take a prescription medicine daily to prevent HIV infection. This is  called preexposure prophylaxis (PrEP). You are considered at risk if:  You are a heterosexual woman, are sexually active, and are at increased risk for HIV infection.  You take drugs by injection.  You are sexually active with a partner who has HIV.  Talk with your health care provider about whether you are at high risk of being infected with HIV. If you choose to begin PrEP, you should first be tested for HIV. You should then be tested every 3 months for as long as you are taking PrEP.  Osteoporosis is a disease in which the bones lose minerals and strength  with aging. This can result in serious bone fractures or breaks. The risk of osteoporosis can be identified using a bone density scan. Women ages 65 years and over and women at risk for fractures or osteoporosis should discuss screening with their health care providers. Ask your health care provider whether you should take a calcium supplement or vitamin D to reduce the rate of osteoporosis.  Menopause can be associated with physical symptoms and risks. Hormone replacement therapy is available to decrease symptoms and risks. You should talk to your health care provider about whether hormone replacement therapy is right for you.  Use sunscreen. Apply sunscreen liberally and repeatedly throughout the day. You should seek shade when your shadow is shorter than you. Protect yourself by wearing long sleeves, pants, a wide-brimmed hat, and sunglasses year round, whenever you are outdoors.  Once a month, do a whole body skin exam, using a mirror to look at the skin on your back. Tell your health care provider of new moles, moles that have irregular borders, moles that are larger than a pencil eraser, or moles that have changed in shape or color.  Stay current with required vaccines (immunizations).  Influenza vaccine. All adults should be immunized every year.  Tetanus, diphtheria, and acellular pertussis (Td, Tdap) vaccine. Pregnant women should  receive 1 dose of Tdap vaccine during each pregnancy. The dose should be obtained regardless of the length of time since the last dose. Immunization is preferred during the 27th-36th week of gestation. An adult who has not previously received Tdap or who does not know her vaccine status should receive 1 dose of Tdap. This initial dose should be followed by tetanus and diphtheria toxoids (Td) booster doses every 10 years. Adults with an unknown or incomplete history of completing a 3-dose immunization series with Td-containing vaccines should begin or complete a primary immunization series including a Tdap dose. Adults should receive a Td booster every 10 years.  Varicella vaccine. An adult without evidence of immunity to varicella should receive 2 doses or a second dose if she has previously received 1 dose. Pregnant females who do not have evidence of immunity should receive the first dose after pregnancy. This first dose should be obtained before leaving the health care facility. The second dose should be obtained 4-8 weeks after the first dose.  Human papillomavirus (HPV) vaccine. Females aged 13-26 years who have not received the vaccine previously should obtain the 3-dose series. The vaccine is not recommended for use in pregnant females. However, pregnancy testing is not needed before receiving a dose. If a female is found to be pregnant after receiving a dose, no treatment is needed. In that case, the remaining doses should be delayed until after the pregnancy. Immunization is recommended for any person with an immunocompromised condition through the age of 26 years if she did not get any or all doses earlier. During the 3-dose series, the second dose should be obtained 4-8 weeks after the first dose. The third dose should be obtained 24 weeks after the first dose and 16 weeks after the second dose.  Zoster vaccine. One dose is recommended for adults aged 60 years or older unless certain conditions are  present.  Measles, mumps, and rubella (MMR) vaccine. Adults born before 1957 generally are considered immune to measles and mumps. Adults born in 1957 or later should have 1 or more doses of MMR vaccine unless there is a contraindication to the vaccine or there is laboratory evidence of immunity to   each of the three diseases. A routine second dose of MMR vaccine should be obtained at least 28 days after the first dose for students attending postsecondary schools, health care workers, or international travelers. People who received inactivated measles vaccine or an unknown type of measles vaccine during 1963-1967 should receive 2 doses of MMR vaccine. People who received inactivated mumps vaccine or an unknown type of mumps vaccine before 1979 and are at high risk for mumps infection should consider immunization with 2 doses of MMR vaccine. For females of childbearing age, rubella immunity should be determined. If there is no evidence of immunity, females who are not pregnant should be vaccinated. If there is no evidence of immunity, females who are pregnant should delay immunization until after pregnancy. Unvaccinated health care workers born before 1957 who lack laboratory evidence of measles, mumps, or rubella immunity or laboratory confirmation of disease should consider measles and mumps immunization with 2 doses of MMR vaccine or rubella immunization with 1 dose of MMR vaccine.  Pneumococcal 13-valent conjugate (PCV13) vaccine. When indicated, a person who is uncertain of her immunization history and has no record of immunization should receive the PCV13 vaccine. An adult aged 19 years or older who has certain medical conditions and has not been previously immunized should receive 1 dose of PCV13 vaccine. This PCV13 should be followed with a dose of pneumococcal polysaccharide (PPSV23) vaccine. The PPSV23 vaccine dose should be obtained at least 8 weeks after the dose of PCV13 vaccine. An adult aged 19  years or older who has certain medical conditions and previously received 1 or more doses of PPSV23 vaccine should receive 1 dose of PCV13. The PCV13 vaccine dose should be obtained 1 or more years after the last PPSV23 vaccine dose.  Pneumococcal polysaccharide (PPSV23) vaccine. When PCV13 is also indicated, PCV13 should be obtained first. All adults aged 65 years and older should be immunized. An adult younger than age 65 years who has certain medical conditions should be immunized. Any person who resides in a nursing home or long-term care facility should be immunized. An adult smoker should be immunized. People with an immunocompromised condition and certain other conditions should receive both PCV13 and PPSV23 vaccines. People with human immunodeficiency virus (HIV) infection should be immunized as soon as possible after diagnosis. Immunization during chemotherapy or radiation therapy should be avoided. Routine use of PPSV23 vaccine is not recommended for American Indians, [place] Natives, or people younger than 65 years unless there are medical conditions that require PPSV23 vaccine. When indicated, people who have unknown immunization and have no record of immunization should receive PPSV23 vaccine. One-time revaccination 5 years after the first dose of PPSV23 is recommended for people aged 19-64 years who have chronic kidney failure, nephrotic syndrome, asplenia, or immunocompromised conditions. People who received 1-2 doses of PPSV23 before age 65 years should receive another dose of PPSV23 vaccine at age 65 years or later if at least 5 years have passed since the previous dose. Doses of PPSV23 are not needed for people immunized with PPSV23 at or after age 65 years.  Meningococcal vaccine. Adults with asplenia or persistent complement component deficiencies should receive 2 doses of quadrivalent meningococcal conjugate (MenACWY-D) vaccine. The doses should be obtained at least 2 months apart.  Microbiologists working with certain meningococcal bacteria, military recruits, people at risk during an outbreak, and people who travel to or live in countries with a high rate of meningitis should be immunized. A first-year college student up through age   21 years who is living in a residence hall should receive a dose if she did not receive a dose on or after her 16th birthday. Adults who have certain high-risk conditions should receive one or more doses of vaccine.  Hepatitis A vaccine. Adults who wish to be protected from this disease, have certain high-risk conditions, work with hepatitis A-infected animals, work in hepatitis A research labs, or travel to or work in countries with a high rate of hepatitis A should be immunized. Adults who were previously unvaccinated and who anticipate close contact with an international adoptee during the first 60 days after arrival in the [place] from a country with a high rate of hepatitis A should be immunized.  Hepatitis B vaccine. Adults who wish to be protected from this disease, have certain high-risk conditions, may be exposed to blood or other infectious body fluids, are household contacts or sex partners of hepatitis B positive people, are clients or workers in certain care facilities, or travel to or work in countries with a high rate of hepatitis B should be immunized.  Haemophilus influenzae type b (Hib) vaccine. A previously unvaccinated person with asplenia or sickle cell disease or having a scheduled splenectomy should receive 1 dose of Hib vaccine. Regardless of previous immunization, a recipient of a hematopoietic stem cell transplant should receive a 3-dose series 6-12 months after her successful transplant. Hib vaccine is not recommended for adults with HIV infection. Preventive Services / Frequency Ages 43 to 39years  Blood pressure check.** / Every 1 to 2 years.  Lipid and cholesterol check.** / Every 5 years beginning at age  75.  Clinical breast exam.** / Every 3 years for women in their 32s and 74s.  BRCA-related cancer risk assessment.** / For women who have family members with a BRCA-related cancer (breast, ovarian, tubal, or peritoneal cancers).  Pap test.** / Every 2 years from ages 65 through 91. Every 3 years starting at age 34 through age 93 or 72 with a history of 3 consecutive normal Pap tests.  HPV screening.** / Every 3 years from ages 46 through ages 53 to 26 with a history of 3 consecutive normal Pap tests.  Hepatitis C blood test.** / For any individual with known risks for hepatitis C.  Skin self-exam. / Monthly.  Influenza vaccine. / Every year.  Tetanus, diphtheria, and acellular pertussis (Tdap, Td) vaccine.** / Consult your health care provider. Pregnant women should receive 1 dose of Tdap vaccine during each pregnancy. 1 dose of Td every 10 years.  Varicella vaccine.** / Consult your health care provider. Pregnant females who do not have evidence of immunity should receive the first dose after pregnancy.  HPV vaccine. / 3 doses over 6 months, if 70 and younger. The vaccine is not recommended for use in pregnant females. However, pregnancy testing is not needed before receiving a dose.  Measles, mumps, rubella (MMR) vaccine.** / You need at least 1 dose of MMR if you were born in 1957 or later. You may also need a 2nd dose. For females of childbearing age, rubella immunity should be determined. If there is no evidence of immunity, females who are not pregnant should be vaccinated. If there is no evidence of immunity, females who are pregnant should delay immunization until after pregnancy.  Pneumococcal 13-valent conjugate (PCV13) vaccine.** / Consult your health care provider.  Pneumococcal polysaccharide (PPSV23) vaccine.** / 1 to 2 doses if you smoke cigarettes or if you have certain conditions.  Meningococcal vaccine.** /  1 dose if you are age 70 to 51 years and a [place] living in a residence hall, or have one of several medical conditions, you need to get vaccinated against meningococcal disease. You may also need additional booster doses.  Hepatitis A vaccine.** / Consult your health care provider.  Hepatitis B vaccine.** / Consult your health care provider.  Haemophilus influenzae type b (Hib) vaccine.** / Consult your health care provider. Ages 40 to 64years  Blood pressure check.** / Every 1 to 2 years.  Lipid and cholesterol check.** / Every 5 years beginning at age 58 years.  Lung cancer screening. / Every year if you are aged 56-80 years and have a 30-pack-year history of smoking and currently smoke or have quit within the past 15 years. Yearly screening is stopped once you have quit smoking for at least 15 years or develop a health problem that would prevent you from having lung cancer treatment.  Clinical breast exam.** / Every year after age 35 years.  BRCA-related cancer risk assessment.** / For women who have family members with a BRCA-related cancer (breast, ovarian, tubal, or peritoneal cancers).  Mammogram.** / Every year beginning at age 109 years and continuing for as long as you are in good health. Consult with your health care provider.  Pap test.** / Every 3 years starting at age 44 years through age 94 or 70 years with a history of 3 consecutive normal Pap tests.  HPV screening.** / Every 3 years from ages 109 years through ages 50 to 30 years with a history of 3 consecutive normal Pap tests.  Fecal occult blood test (FOBT) of stool. / Every year beginning at age 73 years and continuing until age 59 years. You may not need to do this test if you get a colonoscopy every 10 years.  Flexible sigmoidoscopy or colonoscopy.** / Every 5 years for a flexible sigmoidoscopy or every 10 years for a colonoscopy beginning at age 68 years and continuing until age 12 years.  Hepatitis C blood test.** / For all people born from [birth year] through  1965 and any individual with known risks for hepatitis C.  Skin self-exam. / Monthly.  Influenza vaccine. / Every year.  Tetanus, diphtheria, and acellular pertussis (Tdap/Td) vaccine.** / Consult your health care provider. Pregnant women should receive 1 dose of Tdap vaccine during each pregnancy. 1 dose of Td every 10 years.  Varicella vaccine.** / Consult your health care provider. Pregnant females who do not have evidence of immunity should receive the first dose after pregnancy.  Zoster vaccine.** / 1 dose for adults aged 2 years or older.  Measles, mumps, rubella (MMR) vaccine.** / You need at least 1 dose of MMR if you were born in 1957 or later. You may also need a 2nd dose. For females of childbearing age, rubella immunity should be determined. If there is no evidence of immunity, females who are not pregnant should be vaccinated. If there is no evidence of immunity, females who are pregnant should delay immunization until after pregnancy.  Pneumococcal 13-valent conjugate (PCV13) vaccine.** / Consult your health care provider.  Pneumococcal polysaccharide (PPSV23) vaccine.** / 1 to 2 doses if you smoke cigarettes or if you have certain conditions.  Meningococcal vaccine.** / Consult your health care provider.  Hepatitis A vaccine.** / Consult your health care provider.  Hepatitis B vaccine.** / Consult your health care provider.  Haemophilus influenzae type b (Hib) vaccine.** / Consult your health care provider. Ages 48 years  and over  Blood pressure check.** / Every 1 to 2 years.  Lipid and cholesterol check.** / Every 5 years beginning at age 84 years.  Lung cancer screening. / Every year if you are aged 50-80 years and have a 30-pack-year history of smoking and currently smoke or have quit within the past 15 years. Yearly screening is stopped once you have quit smoking for at least 15 years or develop a health problem that would prevent you from having lung cancer  treatment.  Clinical breast exam.** / Every year after age 24 years.  BRCA-related cancer risk assessment.** / For women who have family members with a BRCA-related cancer (breast, ovarian, tubal, or peritoneal cancers).  Mammogram.** / Every year beginning at age 14 years and continuing for as long as you are in good health. Consult with your health care provider.  Pap test.** / Every 3 years starting at age 17 years through age 31 or 74 years with 3 consecutive normal Pap tests. Testing can be stopped between 65 and 70 years with 3 consecutive normal Pap tests and no abnormal Pap or HPV tests in the past 10 years.  HPV screening.** / Every 3 years from ages 30 years through ages 70 or 28 years with a history of 3 consecutive normal Pap tests. Testing can be stopped between 65 and 70 years with 3 consecutive normal Pap tests and no abnormal Pap or HPV tests in the past 10 years.  Fecal occult blood test (FOBT) of stool. / Every year beginning at age 64 years and continuing until age 92 years. You may not need to do this test if you get a colonoscopy every 10 years.  Flexible sigmoidoscopy or colonoscopy.** / Every 5 years for a flexible sigmoidoscopy or every 10 years for a colonoscopy beginning at age 73 years and continuing until age 39 years.  Hepatitis C blood test.** / For all people born from [birth year] through 1966 and any individual with known risks for hepatitis C.  Osteoporosis screening.** / A one-time screening for women ages 35 years and over and women at risk for fractures or osteoporosis.  Skin self-exam. / Monthly.  Influenza vaccine. / Every year.  Tetanus, diphtheria, and acellular pertussis (Tdap/Td) vaccine.** / 1 dose of Td every 10 years.  Varicella vaccine.** / Consult your health care provider.  Zoster vaccine.** / 1 dose for adults aged 59 years or older.  Pneumococcal 13-valent conjugate (PCV13) vaccine.** / Consult your health care provider.  Pneumococcal  polysaccharide (PPSV23) vaccine.** / 1 dose for all adults aged 8 years and older.  Meningococcal vaccine.** / Consult your health care provider.  Hepatitis A vaccine.** / Consult your health care provider.  Hepatitis B vaccine.** / Consult your health care provider.  Haemophilus influenzae type b (Hib) vaccine.** / Consult your health care provider. ** Family history and personal history of risk and conditions may change your health care provider's recommendations. Document Released: 04/13/2001 Document Revised: 02/20/2013 Document Reviewed: 07/13/2010 [facility] Patient Information 2015 [place], [place]. This information is not intended to replace advice given to you by your health care provider. Make sure you discuss any questions you have with your health care provider.

## 2013-08-23 NOTE — Progress Notes (Signed)
Pre visit review using our clinic review tool, if applicable. No additional management support is needed unless otherwise documented below in the visit note. 

## 2013-08-24 NOTE — Telephone Encounter (Signed)
Unable to reach patient pre visit.  

## 2013-08-28 ENCOUNTER — Other Ambulatory Visit (INDEPENDENT_AMBULATORY_CARE_PROVIDER_SITE_OTHER): Payer: BC Managed Care – PPO

## 2013-08-28 DIAGNOSIS — Z0000 Encounter for general adult medical examination without abnormal findings: Secondary | ICD-10-CM

## 2013-08-28 LAB — CBC WITH DIFFERENTIAL/PLATELET
BASOS PCT: 0.6 % (ref 0.0–3.0)
Basophils Absolute: 0 10*3/uL (ref 0.0–0.1)
EOS ABS: 0.1 10*3/uL (ref 0.0–0.7)
Eosinophils Relative: 1.3 % (ref 0.0–5.0)
HEMATOCRIT: 38.2 % (ref 36.0–46.0)
HEMOGLOBIN: 12.8 g/dL (ref 12.0–15.0)
LYMPHS PCT: 35.5 % (ref 12.0–46.0)
Lymphs Abs: 2.3 10*3/uL (ref 0.7–4.0)
MCHC: 33.5 g/dL (ref 30.0–36.0)
MCV: 88.5 fl (ref 78.0–100.0)
Monocytes Absolute: 0.5 10*3/uL (ref 0.1–1.0)
Monocytes Relative: 7.4 % (ref 3.0–12.0)
NEUTROS ABS: 3.6 10*3/uL (ref 1.4–7.7)
Neutrophils Relative %: 55.2 % (ref 43.0–77.0)
Platelets: 304 10*3/uL (ref 150.0–400.0)
RBC: 4.32 Mil/uL (ref 3.87–5.11)
RDW: 13.4 % (ref 11.5–15.5)
WBC: 6.6 10*3/uL (ref 4.0–10.5)

## 2013-08-28 LAB — BASIC METABOLIC PANEL
BUN: 18 mg/dL (ref 6–23)
CHLORIDE: 103 meq/L (ref 96–112)
CO2: 28 meq/L (ref 19–32)
Calcium: 8.9 mg/dL (ref 8.4–10.5)
Creatinine, Ser: 0.7 mg/dL (ref 0.4–1.2)
GFR: 105.55 mL/min (ref >60.00)
GLUCOSE: 93 mg/dL (ref 70–99)
POTASSIUM: 3.9 meq/L (ref 3.5–5.1)
SODIUM: 137 meq/L (ref 135–145)

## 2013-08-28 LAB — HEPATIC FUNCTION PANEL
ALK PHOS: 50 U/L (ref 39–117)
ALT: 13 U/L (ref 0–35)
AST: 17 U/L (ref 0–37)
Albumin: 4.2 g/dL (ref 3.5–5.2)
Bilirubin, Direct: 0 mg/dL (ref 0.0–0.3)
TOTAL PROTEIN: 7.6 g/dL (ref 6.0–8.3)
Total Bilirubin: 0.7 mg/dL (ref 0.2–1.2)

## 2013-08-28 LAB — LIPID PANEL
Cholesterol: 165 mg/dL (ref 0–200)
HDL: 66.9 mg/dL (ref >39.00)
LDL CALC: 87 mg/dL (ref 0–99)
NonHDL: 98.1
TRIGLYCERIDES: 58 mg/dL (ref 0.0–149.0)
Total CHOL/HDL Ratio: 2
VLDL: 11.6 mg/dL (ref 0.0–40.0)

## 2013-08-28 LAB — TSH: TSH: 0.97 u[IU]/mL (ref 0.35–4.50)

## 2013-08-29 LAB — POCT URINALYSIS DIPSTICK
Bilirubin, UA: NEGATIVE
Glucose, UA: NEGATIVE
Ketones, UA: NEGATIVE
Leukocytes, UA: NEGATIVE
Nitrite, UA: NEGATIVE
RBC UA: NEGATIVE
SPEC GRAV UA: 1.015
Urobilinogen, UA: 0.2
pH, UA: 6

## 2013-12-19 ENCOUNTER — Telehealth: Payer: Self-pay | Admitting: Family Medicine

## 2013-12-19 DIAGNOSIS — F988 Other specified behavioral and emotional disorders with onset usually occurring in childhood and adolescence: Secondary | ICD-10-CM

## 2013-12-19 NOTE — Telephone Encounter (Signed)
Caller name: Irving Burtonmily Relation to pt: Call back number: [phone] Pharmacy:  Reason for call: Pt request for rx methylphenidate (RITALIN) 10 MG tablet twice a day. Pt requesting  for 3 month supply so that she doesn't have to visit the office. Pt wants to know if dosage can be increase since pt is taken more dosage and is running out before time. Please advise. Thank you.

## 2013-12-20 NOTE — Telephone Encounter (Signed)
Ritalin request-- she would like to increase the dose.  Last seen and filled 08/23/13 #60 (3 separate prescriptions given for 3 months supply) No UDS or contract   Please advise     KP

## 2013-12-21 NOTE — Telephone Encounter (Signed)
She needs to come in for a visit. And actually I won't give refill for various months. Other week  I spoke with pharmacist who stated per [place] med board guideline PA, NP could not do refills on this level controlled substance. So if she comes in I would give 1 month worth. She needs to have agreement signed and give uds.

## 2013-12-24 ENCOUNTER — Other Ambulatory Visit: Payer: Self-pay

## 2013-12-24 MED ORDER — ESCITALOPRAM OXALATE 10 MG PO TABS: ORAL_TABLET | ORAL | Status: DC

## 2013-12-27 MED ORDER — METHYLPHENIDATE HCL 20 MG PO TABS: 20.0000 mg | ORAL_TABLET | Freq: Two times a day (BID) | ORAL | Status: DC

## 2013-12-27 NOTE — Telephone Encounter (Signed)
Patient aware Rx ready for pick up.      KP 

## 2013-12-27 NOTE — Telephone Encounter (Signed)
Caller name: Kathlee NationsMoran, Reannah Relation to pt: self  Call back number: [phone]   Reason for call:   Pt requesting a refill methylphenidate (RITALIN) 10 MG tablet

## 2013-12-27 NOTE — Telephone Encounter (Signed)
Ritalin 20 mg 1 po bid #60  X 3 months Pt due for ov dec-- need uds

## 2013-12-27 NOTE — Telephone Encounter (Signed)
Ritalin request-- she would like to increase the dose.  Last seen and filled 08/23/13 #60 (3 separate prescriptions given for 3 months supply) No UDS or contract   Please advise     KP 

## 2014-03-22 ENCOUNTER — Ambulatory Visit: Payer: Self-pay | Admitting: Family Medicine

## 2014-03-29 ENCOUNTER — Encounter: Payer: Self-pay | Admitting: Family Medicine

## 2014-03-29 ENCOUNTER — Ambulatory Visit (INDEPENDENT_AMBULATORY_CARE_PROVIDER_SITE_OTHER): Payer: BLUE CROSS/BLUE SHIELD | Admitting: Family Medicine

## 2014-03-29 VITALS — BP 112/70 | HR 122 | Temp 99.4°F | Wt 127.0 lb

## 2014-03-29 DIAGNOSIS — Z3009 Encounter for other general counseling and advice on contraception: Secondary | ICD-10-CM

## 2014-03-29 DIAGNOSIS — F909 Attention-deficit hyperactivity disorder, unspecified type: Secondary | ICD-10-CM

## 2014-03-29 DIAGNOSIS — F988 Other specified behavioral and emotional disorders with onset usually occurring in childhood and adolescence: Secondary | ICD-10-CM

## 2014-03-29 MED ORDER — METHYLPHENIDATE HCL 20 MG PO TABS: 20.0000 mg | ORAL_TABLET | Freq: Two times a day (BID) | ORAL | Status: DC

## 2014-03-29 MED ORDER — NORETHINDRONE 0.35 MG PO TABS: 1.0000 | ORAL_TABLET | Freq: Every day | ORAL | Status: DC
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
## 2014-03-29 NOTE — Progress Notes (Signed)
Pre visit review using our clinic review tool, if applicable. No additional management support is needed unless otherwise documented below in the visit note. 

## 2014-03-29 NOTE — Patient Instructions (Signed)

## 2014-04-08 ENCOUNTER — Ambulatory Visit: Payer: Self-pay | Admitting: Family Medicine

## 2014-06-21 ENCOUNTER — Other Ambulatory Visit: Payer: Self-pay

## 2014-06-21 MED ORDER — ESCITALOPRAM OXALATE 10 MG PO TABS: ORAL_TABLET | ORAL | Status: DC

## 2014-07-03 ENCOUNTER — Telehealth: Payer: Self-pay | Admitting: Family Medicine

## 2014-07-03 DIAGNOSIS — F988 Other specified behavioral and emotional disorders with onset usually occurring in childhood and adolescence: Secondary | ICD-10-CM

## 2014-07-03 NOTE — Telephone Encounter (Signed)
Relation to pt: self  Call back number: [phone]   Reason for call:  Pt requesting 3 month supply methylphenidate (RITALIN) 20 MG tablet

## 2014-07-04 MED ORDER — METHYLPHENIDATE HCL 20 MG PO TABS: 20.0000 mg | ORAL_TABLET | Freq: Two times a day (BID) | ORAL | Status: DC

## 2014-07-04 MED ORDER — METHYLPHENIDATE HCL 20 MG PO TABS
20.0000 mg | ORAL_TABLET | Freq: Two times a day (BID) | ORAL | Status: DC
Start: 2014-07-04 — End: 2014-07-04

## 2014-07-04 NOTE — Addendum Note (Signed)
Addended by: Arnette NorrisPAYNE, Zion Ta P on: 07/04/2014 01:20 PM   Modules accepted: Orders

## 2014-07-04 NOTE — Telephone Encounter (Signed)
Message left advising Rx ready fir pick up. Contract left to be signed.

## 2014-09-09 ENCOUNTER — Ambulatory Visit: Payer: BLUE CROSS/BLUE SHIELD | Admitting: Family Medicine

## 2014-09-30 ENCOUNTER — Encounter: Payer: Self-pay | Admitting: Family Medicine

## 2014-09-30 ENCOUNTER — Ambulatory Visit (INDEPENDENT_AMBULATORY_CARE_PROVIDER_SITE_OTHER): Payer: BLUE CROSS/BLUE SHIELD | Admitting: Family Medicine

## 2014-09-30 VITALS — BP 118/74 | HR 108 | Temp 99.2°F | Ht 65.0 in | Wt 134.0 lb

## 2014-09-30 DIAGNOSIS — R21 Rash and other nonspecific skin eruption: Secondary | ICD-10-CM | POA: Diagnosis not present

## 2014-09-30 DIAGNOSIS — F909 Attention-deficit hyperactivity disorder, unspecified type: Secondary | ICD-10-CM | POA: Diagnosis not present

## 2014-09-30 DIAGNOSIS — F988 Other specified behavioral and emotional disorders with onset usually occurring in childhood and adolescence: Secondary | ICD-10-CM

## 2014-09-30 MED ORDER — PREDNISONE 10 MG PO TABS: ORAL_TABLET | ORAL | Status: DC

## 2014-09-30 MED ORDER — METHYLPHENIDATE HCL 20 MG PO TABS: 20.0000 mg | ORAL_TABLET | Freq: Two times a day (BID) | ORAL | Status: DC

## 2014-09-30 NOTE — Patient Instructions (Addendum)

## 2014-09-30 NOTE — Progress Notes (Signed)
Pre visit review using our clinic review tool, if applicable. No additional management support is needed unless otherwise documented below in the visit note. 

## 2014-09-30 NOTE — Progress Notes (Signed)
Subjective:    Patient ID: Christina Villarreal, female    DOB: 1980/04/07, 34 y.o.   MRN: [id number]  HPI  Patient here for f/u ADD.  She is also c/o rash on upper back and neck.  She noticed rash after beach.  It is very itchy.  No hx insect bite. No new lotions, cleansers , perfumes etc.    Past Medical History  Diagnosis Date  . ADD (attention deficit disorder)   . GERD (gastroesophageal reflux disease)     Review of Systems  Constitutional: Negative for diaphoresis, appetite change, fatigue and unexpected weight change.  Eyes: Negative for pain, redness and visual disturbance.  Respiratory: Negative for cough, chest tightness, shortness of breath and wheezing.   Cardiovascular: Negative for chest pain, palpitations and leg swelling.  Endocrine: Negative for cold intolerance, heat intolerance, polydipsia, polyphagia and polyuria.  Genitourinary: Negative for dysuria, frequency and difficulty urinating.  Neurological: Negative for dizziness, light-headedness, numbness and headaches.  Psychiatric/Behavioral: Negative for decreased concentration. The patient is not nervous/anxious.     Current Outpatient Prescriptions on File Prior to Visit  Medication Sig Dispense Refill  . escitalopram (LEXAPRO) 10 MG tablet TAKE 1 TABLET BY MOUTH EVERY NIGHT AT BEDTIME 90 tablet 1  . valACYclovir (VALTREX) 500 MG tablet Take 500 mg by mouth daily.      No current facility-administered medications on file prior to visit.       Objective:    Physical Exam  Constitutional: She is oriented to person, place, and time. She appears well-developed and well-nourished.  HENT:  Head: Normocephalic and atraumatic.  Eyes: Conjunctivae and EOM are normal.  Neck: Normal range of motion. Neck supple. No JVD present. Carotid bruit is not present. No thyromegaly present.  Cardiovascular: Normal rate, regular rhythm and normal heart sounds.   No murmur heard. Pulmonary/Chest: Effort normal and breath sounds  normal. No respiratory distress. She has no wheezes. She has no rales. She exhibits no tenderness.  Musculoskeletal: She exhibits no edema.  Neurological: She is alert and oriented to person, place, and time.  Skin: Rash noted.     Psychiatric: She has a normal mood and affect. Her behavior is normal.    BP 118/74 mmHg  Pulse 108  Temp(Src) 99.2 F (37.3 C) (Oral)  Ht 5\' 5"  (1.651 m)  Wt 134 lb (60.782 kg)  BMI 22.30 kg/m2  SpO2 99%  LMP 09/16/2014 Wt Readings from Last 3 Encounters:  09/30/14 134 lb (60.782 kg)  03/29/14 127 lb (57.607 kg)  08/23/13 117 lb 6.4 oz (53.252 kg)     Lab Results  Component Value Date   WBC 6.6 08/28/2013   HGB 12.8 08/28/2013   HCT 38.2 08/28/2013   PLT 304.0 08/28/2013   GLUCOSE 93 08/28/2013   CHOL 165 08/28/2013   TRIG 58.0 08/28/2013   HDL 66.90 08/28/2013   LDLCALC 87 08/28/2013   ALT 13 08/28/2013   AST 17 08/28/2013   NA 137 08/28/2013   K 3.9 08/28/2013   CL 103 08/28/2013   CREATININE 0.7 08/28/2013   BUN 18 08/28/2013   CO2 28 08/28/2013   TSH 0.97 08/28/2013       Assessment & Plan:   Problem List Items Addressed This Visit    None    Visit Diagnoses    ADD (attention deficit disorder)    -  Primary    Relevant Medications    methylphenidate (RITALIN) 20 MG tablet    methylphenidate (RITALIN) 20 MG tablet  methylphenidate (RITALIN) 20 MG tablet    Rash and nonspecific skin eruption        Relevant Medications    predniSONE (DELTASONE) 10 MG tablet            ? Etiology of rash--- rto prn  I have discontinued Ms. Halderman's norethindrone. I am also having her start on predniSONE. Additionally, I am having her maintain her valACYclovir, escitalopram, RECLIPSEN, methylphenidate, methylphenidate, and methylphenidate.  Meds ordered this encounter  Medications  . RECLIPSEN 0.15-30 MG-MCG tablet    Sig: Take 1 tablet by mouth daily.    Refill:  11  . methylphenidate (RITALIN) 20 MG tablet    Sig: Take 1 tablet  (20 mg total) by mouth 2 (two) times daily.    Dispense:  60 tablet    Refill:  0    Do not fill until Oct 2016  . methylphenidate (RITALIN) 20 MG tablet    Sig: Take 1 tablet (20 mg total) by mouth 2 (two) times daily.    Dispense:  60 tablet    Refill:  0    Do not fill until Sept 2016  . methylphenidate (RITALIN) 20 MG tablet    Sig: Take 1 tablet (20 mg total) by mouth 2 (two) times daily.    Dispense:  60 tablet    Refill:  0  . predniSONE (DELTASONE) 10 MG tablet    Sig: 3 po qd for 3 days then 2 po qd for 3 days the 1 po qd for 3 days    Dispense:  18 tablet    Refill:  0     Loreen Freud, DO

## 2014-10-07 ENCOUNTER — Encounter: Payer: Self-pay | Admitting: Family Medicine

## 2014-11-25 ENCOUNTER — Telehealth: Payer: Self-pay | Admitting: *Deleted

## 2014-11-25 NOTE — Telephone Encounter (Signed)
PA for methylphenidate 20 mg tablets initiated. Awaiting determination. JG//CMA

## 2014-11-27 NOTE — Telephone Encounter (Addendum)
Relation to ZO:XWRU  Call back number: [phone] Pharmacy:  Reason for call:  Patient checking on the status of of PA

## 2014-11-28 NOTE — Telephone Encounter (Signed)
This PA was submitted to [facility] on 11/25/14 and this again just now.

## 2014-11-28 NOTE — Telephone Encounter (Signed)
Caller name:Vanesha Relationship to patient:PATIENT Can be reached: Pharmacy:  Reason for call:PATIENT CALLED TO STATE SHE HAD CALLED HER INSURANCE COMPANY AND THEY STATE THEY HAVE NOT RECEIVED THE PRIOR AUTHORIZATION FOR HER MED

## 2014-11-29 ENCOUNTER — Telehealth: Payer: Self-pay | Admitting: Family Medicine

## 2014-11-29 NOTE — Telephone Encounter (Signed)
Please refer to previous phone note.  The request I received was for [facility]. Reworked PA and sent to [employer]. Awaiting determination. JG//CMA

## 2014-11-29 NOTE — Telephone Encounter (Signed)
PA approved through 11/29/15. JG//CMA

## 2014-11-29 NOTE — Telephone Encounter (Signed)
Caller name: Dewayne  Relation to pt: [facility] Call back number: [phone] option 2 then 1    Reason for call:  [facility] called on patient behalf requesting prior auth for methylphenidate (RITALIN) 20 MG tablet.

## 2014-12-31 ENCOUNTER — Other Ambulatory Visit: Payer: Self-pay | Admitting: Family Medicine

## 2015-05-06 ENCOUNTER — Encounter: Payer: Self-pay | Admitting: Family Medicine

## 2015-05-06 ENCOUNTER — Ambulatory Visit (INDEPENDENT_AMBULATORY_CARE_PROVIDER_SITE_OTHER): Payer: BC Managed Care – PPO | Admitting: Family Medicine

## 2015-05-06 VITALS — BP 110/76 | HR 74 | Temp 98.5°F | Wt 135.4 lb

## 2015-05-06 DIAGNOSIS — F909 Attention-deficit hyperactivity disorder, unspecified type: Secondary | ICD-10-CM | POA: Diagnosis not present

## 2015-05-06 DIAGNOSIS — H9193 Unspecified hearing loss, bilateral: Secondary | ICD-10-CM

## 2015-05-06 DIAGNOSIS — F988 Other specified behavioral and emotional disorders with onset usually occurring in childhood and adolescence: Secondary | ICD-10-CM

## 2015-05-06 MED ORDER — METHYLPHENIDATE HCL 20 MG PO TABS: 20.0000 mg | ORAL_TABLET | Freq: Two times a day (BID) | ORAL | Status: DC

## 2015-05-06 NOTE — Patient Instructions (Signed)
Hearing Loss °Hearing loss is a partial or total loss of the ability to hear. This can be temporary or permanent, and it can happen in one or both ears. Hearing loss may be referred to as deafness. °Medical care is necessary to treat hearing loss properly and to prevent the condition from getting worse. Your hearing may partially or completely come back, depending on what caused your hearing loss and how severe it is. In some cases, hearing loss is permanent. °CAUSES °Common causes of hearing loss include:  °· Too much wax in the ear canal.   °· Infection of the ear canal or middle ear.   °· Fluid in the middle ear.   °· Injury to the ear or surrounding area.   °· An object stuck in the ear.   °· Prolonged exposure to loud sounds, such as music.   °Less common causes of hearing loss include:  °· Tumors in the ear.   °· Viral or bacterial infections, such as meningitis.   °· A hole in the eardrum (perforated eardrum). °· Problems with the hearing nerve that sends signals between the brain and the ear. °· Certain medicines.   °SYMPTOMS  °Symptoms of this condition may include: °· Difficulty telling the difference between sounds. °· Difficulty following a conversation when there is background noise. °· Lack of response to sounds in your environment. This may be most noticeable when you do not respond to startling sounds. °· Needing to turn up the volume on the television, radio, etc. °· Ringing in the ears. °· Dizziness. °· Pain in the ears. °DIAGNOSIS °This condition is diagnosed based on a physical exam and a hearing test (audiometry). The audiometry test will be performed by a hearing specialist (audiologist). You may also be referred to an ear, nose, and throat (ENT) specialist (otolaryngologist).  °TREATMENT °Treatment for recent onset of hearing loss may include:  °· Ear wax removal.   °· Being prescribed medicines to prevent infection (antibiotics).   °· Being prescribed medicines to reduce inflammation  (corticosteroids).   °HOME CARE INSTRUCTIONS °· If you were prescribed an antibiotic medicine, take it as told by your health care provider. Do not stop taking the antibiotic even if you start to feel better. °· Take over-the-counter and prescription medicines only as told by your health care provider. °· Avoid loud noises.   °· Return to your normal activities as told by your health care provider. Ask your health care provider what activities are safe for you. °· Keep all follow-up visits as told by your health care provider. This is important. °SEEK MEDICAL CARE IF:  °· You feel dizzy.   °· You develop new symptoms.   °· You vomit or feel nauseous.   °· You have a fever.   °SEEK IMMEDIATE MEDICAL CARE IF: °· You develop sudden changes in your vision.   °· You have severe ear pain.   °· You have new or increased weakness. °· You have a severe headache. °  °This information is not intended to replace advice given to you by your health care provider. Make sure you discuss any questions you have with your health care provider. °  °Document Released: 02/15/2005 Document Revised: 11/06/2014 Document Reviewed: 07/03/2014 °Elsevier Interactive Patient Education ©2016 [employer] ° °

## 2015-05-06 NOTE — Progress Notes (Signed)
Pre visit review using our clinic review tool, if applicable. No additional management support is needed unless otherwise documented below in the visit note. 

## 2015-05-07 NOTE — Progress Notes (Signed)
Patient ID: Christina Villarreal, female    DOB: [birth year]  Age: 35 y.o. MRN: [id number]    Subjective:  Subjective HPI Christina Villarreal presents for f/u add and c/o hearing loss.  No congestion , no cold symptoms  Review of Systems  Constitutional: Negative for diaphoresis, appetite change, fatigue and unexpected weight change.  HENT: Positive for hearing loss.   Eyes: Negative for pain, redness and visual disturbance.  Respiratory: Negative for cough, chest tightness, shortness of breath and wheezing.   Cardiovascular: Negative for chest pain, palpitations and leg swelling.  Endocrine: Negative for cold intolerance, heat intolerance, polydipsia, polyphagia and polyuria.  Genitourinary: Negative for dysuria, frequency and difficulty urinating.  Neurological: Negative for dizziness, light-headedness, numbness and headaches.    History Past Medical History  Diagnosis Date  . ADD (attention deficit disorder)   . GERD (gastroesophageal reflux disease)     She has no past surgical history on file.   Her family history is not on file.She reports that she has never smoked. She has never used smokeless tobacco. She reports that she does not drink alcohol or use illicit drugs.  Current Outpatient Prescriptions on File Prior to Visit  Medication Sig Dispense Refill  . valACYclovir (VALTREX) 500 MG tablet Take 500 mg by mouth daily.      No current facility-administered medications on file prior to visit.     Objective:  Objective Physical Exam  Constitutional: She is oriented to person, place, and time. She appears well-developed and well-nourished.  HENT:  Head: Normocephalic and atraumatic.  Eyes: Conjunctivae and EOM are normal.  Neck: Normal range of motion. Neck supple. No JVD present. Carotid bruit is not present. No thyromegaly present.  Cardiovascular: Normal rate, regular rhythm and normal heart sounds.   No murmur heard. Pulmonary/Chest: Effort normal and breath sounds normal. No  respiratory distress. She has no wheezes. She has no rales. She exhibits no tenderness.  Musculoskeletal: She exhibits no edema.  Neurological: She is alert and oriented to person, place, and time.  Psychiatric: She has a normal mood and affect.  Nursing note and vitals reviewed.  BP 110/76 mmHg  Pulse 74  Temp(Src) 98.5 F (36.9 C) (Oral)  Wt 135 lb 6.4 oz (61.417 kg)  SpO2 98%  LMP 05/04/2015 Wt Readings from Last 3 Encounters:  05/06/15 135 lb 6.4 oz (61.417 kg)  09/30/14 134 lb (60.782 kg)  03/29/14 127 lb (57.607 kg)     Lab Results  Component Value Date   WBC 6.6 08/28/2013   HGB 12.8 08/28/2013   HCT 38.2 08/28/2013   PLT 304.0 08/28/2013   GLUCOSE 93 08/28/2013   CHOL 165 08/28/2013   TRIG 58.0 08/28/2013   HDL 66.90 08/28/2013   LDLCALC 87 08/28/2013   ALT 13 08/28/2013   AST 17 08/28/2013   NA 137 08/28/2013   K 3.9 08/28/2013   CL 103 08/28/2013   CREATININE 0.7 08/28/2013   BUN 18 08/28/2013   CO2 28 08/28/2013   TSH 0.97 08/28/2013    No results found.   Assessment & Plan:  Plan I have discontinued Ms. Ruacho's escitalopram, RECLIPSEN, predniSONE, and escitalopram. I am also having her maintain her valACYclovir and methylphenidate.  Meds ordered this encounter  Medications  . methylphenidate (RITALIN) 20 MG tablet    Sig: Take 1 tablet (20 mg total) by mouth 2 (two) times daily.    Dispense:  60 tablet    Refill:  0    Problem List Items Addressed This  Visit    None    Visit Diagnoses    ADD (attention deficit disorder)    -  Primary    Relevant Medications    methylphenidate (RITALIN) 20 MG tablet    Hearing loss, bilateral        Relevant Orders    Ambulatory referral to ENT       Follow-up: Return if symptoms worsen or fail to improve.  Loreen FreudYvonne Lowne, DO

## 2015-07-25 ENCOUNTER — Telehealth: Payer: Self-pay | Admitting: Medical

## 2015-07-25 ENCOUNTER — Ambulatory Visit (INDEPENDENT_AMBULATORY_CARE_PROVIDER_SITE_OTHER): Payer: BLUE CROSS/BLUE SHIELD | Admitting: Medical

## 2015-07-25 ENCOUNTER — Encounter: Payer: Self-pay | Admitting: Medical

## 2015-07-25 ENCOUNTER — Ambulatory Visit (HOSPITAL_BASED_OUTPATIENT_CLINIC_OR_DEPARTMENT_OTHER)
Admission: RE | Admit: 2015-07-25 | Discharge: 2015-07-25 | Disposition: A | Discharge status: Home / Self Care | Payer: BLUE CROSS/BLUE SHIELD | Source: Ambulatory Visit | Attending: Medical | Admitting: Medical

## 2015-07-25 VITALS — BP 110/76 | HR 70 | Temp 98.1°F | Ht 65.0 in | Wt 137.8 lb

## 2015-07-25 DIAGNOSIS — M25561 Pain in right knee: Secondary | ICD-10-CM | POA: Insufficient documentation

## 2015-07-25 DIAGNOSIS — S86819A Strain of other muscle(s) and tendon(s) at lower leg level, unspecified leg, initial encounter: Secondary | ICD-10-CM

## 2015-07-25 MED ORDER — KETOROLAC TROMETHAMINE 60 MG/2ML IM SOLN
60.0000 mg | Freq: Once | INTRAMUSCULAR | Status: AC
  Administered 2015-07-25: 60 mg via INTRAMUSCULAR

## 2015-07-25 MED ORDER — DICLOFENAC SODIUM 75 MG PO TBEC: 75.0000 mg | DELAYED_RELEASE_TABLET | Freq: Two times a day (BID) | ORAL | Status: DC

## 2015-07-25 NOTE — Progress Notes (Signed)
Pre visit review using our clinic review tool, if applicable. No additional management support is needed unless otherwise documented below in the visit note. 

## 2015-07-25 NOTE — Progress Notes (Signed)
   Subjective:    Patient ID: Christina Villarreal, female    DOB: 03/22/1980, 35 y.o.   MRN: [id number]  HPI   Pt states she felt some pain in her rt knee after running. She had to run outside since gym electricity was out. She was varying her speed. The 2 days later ran on eliptical.  Pt states no hx of knee pain before.   Pt has 2 children 206 yo and 583 yo. So not resting.  Both calfs are little sore.   With acitivity rt knee hurts. But not at rest. Turing her knees hurts.  LMP- 2 wks ago. Normal and came when expected.    Review of Systems  Constitutional: Negative for chills and fatigue.  Respiratory: Negative for cough, choking, shortness of breath and wheezing.   Cardiovascular: Negative for chest pain and palpitations.  Gastrointestinal: Negative for abdominal pain.  Musculoskeletal:       Rt knee pain.     Past Medical History  Diagnosis Date  . ADD (attention deficit disorder)   . GERD (gastroesophageal reflux disease)      Social History   Social History  . Marital Status: Single    Spouse Name: N/A  . Number of Children: N/A  . Years of Education: N/A   Occupational History  . Not on file.   Social History Main Topics  . Smoking status: Never Smoker   . Smokeless tobacco: Never Used  . Alcohol Use: No  . Drug Use: No  . Sexual Activity: Not on file   Other Topics Concern  . Not on file   Social History Narrative    No past surgical history on file.  No family history on file.  No Known Allergies  Current Outpatient Prescriptions on File Prior to Visit  Medication Sig Dispense Refill  . methylphenidate (RITALIN) 20 MG tablet Take 1 tablet (20 mg total) by mouth 2 (two) times daily. 60 tablet 0  . valACYclovir (VALTREX) 500 MG tablet Take 500 mg by mouth daily.      No current facility-administered medications on file prior to visit.    BP 110/76 mmHg  Pulse 70  Temp(Src) 98.1 F (36.7 C) (Oral)  Ht 5\' 5"  (1.651 m)  Wt 137 lb 12.8 oz  (62.506 kg)  BMI 22.93 kg/m2  SpO2 98%       Objective:   Physical Exam  General- No acute distress. Pleasant patient. Neck- Full range of motion, no jvd Lungs- Clear, even and unlabored. Heart- regular rate and rhythm. Neurologic- CNII- XII grossly intact.   Rt knee- faint swelling of knee, medial tibial plateau pain. Valgus and varus stress  pain in this area as well. Calf symmetric to left side.Negative homans signs.      Assessment & Plan:  For your knee pain will get rt knee xray and give toradol 60 mg im.  Start diclofenac tomorrow.  Rest for one week. Start back very slow. But if knee pain with acitivity by one week will refer to sports med or ortho to evaluate meniscus.  I think you have normal variant calf soreness(both sides)  from exercise. But if you get swelling of either calf or pain behind the knees then would get immediate US of lower ext. Over weekend at ED if this were to happen.  Follow up in 7 days or as needed

## 2015-07-25 NOTE — Patient Instructions (Addendum)
For your knee pain will get rt knee xray and give toradol 60 mg im.  Start diclofenac tomorrow.  Rest for one week. Start back very slow. But if knee pain with acitivity by one week will refer to sports med or ortho to evaluate meniscus.  I think you have normal variant calf soreness(both sides)  from exercise. But if you get swelling of either calf or pain behind the knees then would get immediate US of lower ext. Over weekend at ED if this were to happen.  Follow up in 7 days or as needed

## 2015-07-25 NOTE — Addendum Note (Signed)
Addended by: Gwenevere AbbotSAGUIER, Kollyns Mickelson M on: 07/25/2015 03:45 PM   Modules accepted: Kipp BroodSmartSet

## 2015-07-25 NOTE — Addendum Note (Signed)
Addended by: Neldon LabellaMABE, Nhi Butrum S on: 07/25/2015 03:29 PM   Modules accepted: Orders

## 2015-07-25 NOTE — Telephone Encounter (Signed)
Knee pain

## 2015-07-27 ENCOUNTER — Telehealth: Payer: Self-pay | Admitting: Medical

## 2015-07-27 DIAGNOSIS — M25561 Pain in right knee: Secondary | ICD-10-CM

## 2015-07-27 NOTE — Telephone Encounter (Signed)
Will go ahead and refer to orthopedist. Since abnormal finding. Ortho may decide to do mri.

## 2015-07-29 NOTE — Telephone Encounter (Signed)
Spoke with pt and she voices understanding. Pt did not have any further questions.

## 2015-08-13 ENCOUNTER — Ambulatory Visit (INDEPENDENT_AMBULATORY_CARE_PROVIDER_SITE_OTHER): Payer: BLUE CROSS/BLUE SHIELD | Admitting: Medical

## 2015-08-13 ENCOUNTER — Encounter: Payer: Self-pay | Admitting: Medical

## 2015-08-13 VITALS — BP 108/76 | HR 70 | Temp 98.1°F | Ht 65.0 in | Wt 139.0 lb

## 2015-08-13 DIAGNOSIS — M25561 Pain in right knee: Secondary | ICD-10-CM | POA: Diagnosis not present

## 2015-08-13 DIAGNOSIS — R0981 Nasal congestion: Secondary | ICD-10-CM | POA: Diagnosis not present

## 2015-08-13 DIAGNOSIS — J0100 Acute maxillary sinusitis, unspecified: Secondary | ICD-10-CM

## 2015-08-13 DIAGNOSIS — J029 Acute pharyngitis, unspecified: Secondary | ICD-10-CM | POA: Diagnosis not present

## 2015-08-13 LAB — POCT RAPID STREP A (OFFICE): RAPID STREP A SCREEN: NEGATIVE

## 2015-08-13 MED ORDER — AZITHROMYCIN 250 MG PO TABS: ORAL_TABLET | ORAL | Status: DC

## 2015-08-13 MED ORDER — FLUTICASONE PROPIONATE 50 MCG/ACT NA SUSP: 2.0000 | Freq: Every day | NASAL | Status: DC

## 2015-08-13 NOTE — Progress Notes (Signed)
Subjective:    Patient ID: Christina Villarreal, female    DOB: December 27, 1980, 35 y.o.   MRN: [id number]  HPI  Pt in states her daughter had strep throat recently on Friday. Mom has had some throat, nasal congestion, swollen lymph nodes submandibular area and fatigue.  Some recent sinus pressure as well.    Review of Systems  Constitutional: Negative for fever, chills and fatigue.  HENT: Positive for congestion, sinus pressure and sore throat. Negative for ear pain and postnasal drip.   Respiratory: Negative for cough, chest tightness, shortness of breath and wheezing.   Cardiovascular: Negative for chest pain and palpitations.  Gastrointestinal: Negative for abdominal pain.  Musculoskeletal: Negative for myalgias and back pain.  Skin: Negative for rash.  Neurological: Negative for dizziness and headaches.  Hematological: Negative for adenopathy.  Psychiatric/Behavioral: Negative for behavioral problems and confusion.    Past Medical History  Diagnosis Date  . ADD (attention deficit disorder)   . GERD (gastroesophageal reflux disease)      Social History   Social History  . Marital Status: Single    Spouse Name: N/A  . Number of Children: N/A  . Years of Education: N/A   Occupational History  . Not on file.   Social History Main Topics  . Smoking status: Never Smoker   . Smokeless tobacco: Never Used  . Alcohol Use: No  . Drug Use: No  . Sexual Activity: Not on file   Other Topics Concern  . Not on file   Social History Narrative    No past surgical history on file.  No family history on file.  No Known Allergies  Current Outpatient Prescriptions on File Prior to Visit  Medication Sig Dispense Refill  . diclofenac (VOLTAREN) 75 MG EC tablet Take 1 tablet (75 mg total) by mouth 2 (two) times daily. 30 tablet 0  . methylphenidate (RITALIN) 20 MG tablet Take 1 tablet (20 mg total) by mouth 2 (two) times daily. 60 tablet 0  . valACYclovir (VALTREX) 500 MG tablet  Take 500 mg by mouth daily.      No current facility-administered medications on file prior to visit.    BP 108/76 mmHg  Pulse 70  Temp(Src) 98.1 F (36.7 C) (Oral)  Ht  (1.651 m)  Wt 139 lb (63.05 kg)  BMI 23.13 kg/m2  SpO2 98%  LMP 07/11/2015       Objective:   Physical Exam  General  Mental Status - Alert. General Appearance - Well groomed. Not in acute distress.  Skin Rashes- No Rashes.  HEENT Head- Normal. Ear Auditory Canal - Left- Normal. Right - Normal.Tympanic Membrane- Left- Normal. Right- Normal. Eye Sclera/Conjunctiva- Left- Normal. Right- Normal. Nose & Sinuses Nasal Mucosa- Left-  Boggy and Congested. Right-  Boggy and  Congested.Bilateral  maxillary but no  frontal sinus pressure. Mouth & Throat Lips: Upper Lip- Normal: no dryness, cracking, pallor, cyanosis, or vesicular eruption. Lower Lip-Normal: no dryness, cracking, pallor, cyanosis or vesicular eruption. Buccal Mucosa- Bilateral- No Aphthous ulcers. Oropharynx- No Discharge or Erythema. Tonsils: Characteristics- Bilateral- Erythema and mild  Congestion. Size/Enlargement- Bilateral- 1+  enlargement. Discharge- bilateral-None.  Neck Neck- Supple. No Masses. Enlarged submandibular lymph node.   Chest and Lung Exam Auscultation: Breath Sounds:-Clear even and unlabored.  Cardiovascular Auscultation:Rythm- Regular, rate and rhythm. Murmurs & Other Heart Sounds:Ausculatation of the heart reveal- No Murmurs.  Lymphatic Head & Neck General Head & Neck Lymphatics: Bilateral: Description- see neck exam.  Assessment & Plan:  You had strep contact and your throat looks suspicious with swollen lymph node so I am prescribing azithromycin.  Azithromycin can treat your sinus infection as well.  For nasal congestion will flonase.  We discussed your recent knee pain and xray finding. I will write referral to Sports medicine.  Follow up in 7 days or as needed

## 2015-08-13 NOTE — Patient Instructions (Addendum)
You had strep contact and your throat looks suspicious with swollen lymph node so I am prescribing azithromycin.  Azithromycin can treat your sinus infection as well.  For nasal congestion will flonase.  We discussed your recent knee pain and xray finding. I will write referral to Sports medicine.  Follow up in 7 days or as needed

## 2015-08-13 NOTE — Progress Notes (Signed)
Pre visit review using our clinic review tool, if applicable. No additional management support is needed unless otherwise documented below in the visit note. 

## 2015-08-15 ENCOUNTER — Ambulatory Visit: Payer: BLUE CROSS/BLUE SHIELD | Admitting: Family Medicine

## 2015-08-15 LAB — CULTURE, GROUP A STREP: ORGANISM ID, BACTERIA: NORMAL

## 2015-08-18 NOTE — Addendum Note (Signed)
Addended by: Neldon LabellaMABE, HOLDEN S on: 08/18/2015 12:06 PM   Modules accepted: Kipp BroodSmartSet

## 2016-04-26 ENCOUNTER — Ambulatory Visit (INDEPENDENT_AMBULATORY_CARE_PROVIDER_SITE_OTHER): Payer: BC Managed Care – PPO | Admitting: Family Medicine

## 2016-04-26 ENCOUNTER — Encounter: Payer: Self-pay | Admitting: Family Medicine

## 2016-04-26 VITALS — BP 98/62 | HR 83 | Temp 98.0°F | Resp 16 | Ht 65.0 in | Wt 143.0 lb

## 2016-04-26 DIAGNOSIS — R8290 Unspecified abnormal findings in urine: Secondary | ICD-10-CM

## 2016-04-26 DIAGNOSIS — R5383 Other fatigue: Secondary | ICD-10-CM

## 2016-04-26 LAB — CBC WITH DIFFERENTIAL/PLATELET
BASOS ABS: 0 10*3/uL (ref 0.0–0.1)
Basophils Relative: 0.6 % (ref 0.0–3.0)
EOS PCT: 0.7 % (ref 0.0–5.0)
Eosinophils Absolute: 0.1 10*3/uL (ref 0.0–0.7)
HCT: 41.4 % (ref 36.0–46.0)
Hemoglobin: 14.1 g/dL (ref 12.0–15.0)
LYMPHS ABS: 2.2 10*3/uL (ref 0.7–4.0)
Lymphocytes Relative: 25.8 % (ref 12.0–46.0)
MCHC: 34 g/dL (ref 30.0–36.0)
MCV: 88.8 fl (ref 78.0–100.0)
MONO ABS: 0.6 10*3/uL (ref 0.1–1.0)
MONOS PCT: 7.4 % (ref 3.0–12.0)
NEUTROS PCT: 65.5 % (ref 43.0–77.0)
Neutro Abs: 5.6 10*3/uL (ref 1.4–7.7)
Platelets: 334 10*3/uL (ref 150.0–400.0)
RBC: 4.66 Mil/uL (ref 3.87–5.11)
RDW: 12.9 % (ref 11.5–15.5)
WBC: 8.6 10*3/uL (ref 4.0–10.5)

## 2016-04-26 LAB — IBC PANEL
IRON: 86 ug/dL (ref 42–145)
Saturation Ratios: 16.8 % — ABNORMAL LOW (ref 20.0–50.0)
TRANSFERRIN: 365 mg/dL — AB (ref 212.0–360.0)

## 2016-04-26 LAB — COMPREHENSIVE METABOLIC PANEL
ALBUMIN: 4.4 g/dL (ref 3.5–5.2)
ALK PHOS: 39 U/L (ref 39–117)
ALT: 14 U/L (ref 0–35)
AST: 20 U/L (ref 0–37)
BILIRUBIN TOTAL: 0.5 mg/dL (ref 0.2–1.2)
BUN: 15 mg/dL (ref 6–23)
CO2: 26 mEq/L (ref 19–32)
Calcium: 9.5 mg/dL (ref 8.4–10.5)
Chloride: 101 mEq/L (ref 96–112)
Creatinine, Ser: 0.75 mg/dL (ref 0.40–1.20)
GFR: 92.81 mL/min (ref >60.00)
GLUCOSE: 88 mg/dL (ref 70–99)
Potassium: 4.3 mEq/L (ref 3.5–5.1)
SODIUM: 135 meq/L (ref 135–145)
TOTAL PROTEIN: 7.7 g/dL (ref 6.0–8.3)

## 2016-04-26 LAB — POC URINALSYSI DIPSTICK (AUTOMATED)
BILIRUBIN UA: NEGATIVE
Glucose, UA: NEGATIVE
KETONES UA: NEGATIVE
Leukocytes, UA: NEGATIVE
Nitrite, UA: NEGATIVE
PH UA: 7
Protein, UA: NEGATIVE
SPEC GRAV UA: 1.02
Urobilinogen, UA: NEGATIVE

## 2016-04-26 LAB — FERRITIN: FERRITIN: 24.5 ng/mL (ref 10.0–291.0)

## 2016-04-26 LAB — VITAMIN D 25 HYDROXY (VIT D DEFICIENCY, FRACTURES): VITD: 42.97 ng/mL (ref 30.00–100.00)

## 2016-04-26 LAB — VITAMIN B12: VITAMIN B 12: 683 pg/mL (ref 211–911)

## 2016-04-26 LAB — TSH: TSH: 3.1 u[IU]/mL (ref 0.35–4.50)

## 2016-04-26 NOTE — Patient Instructions (Signed)
Fatigue Introduction Fatigue is feeling tired all of the time, a lack of energy, or a lack of motivation. Occasional or mild fatigue is often a normal response to activity or life in general. However, long-lasting (chronic) or extreme fatigue may indicate an underlying medical condition. Follow these instructions at home: Watch your fatigue for any changes. The following actions may help to lessen any discomfort you are feeling:  Talk to your health care provider about how much sleep you need each night. Try to get the required amount every night.  Take medicines only as directed by your health care provider.  Eat a healthy and nutritious diet. Ask your health care provider if you need help changing your diet.  Drink enough fluid to keep your urine clear or pale yellow.  Practice ways of relaxing, such as yoga, meditation, massage therapy, or acupuncture.  Exercise regularly.  Change situations that cause you stress. Try to keep your work and personal routine reasonable.  Do not abuse illegal drugs.  Limit alcohol intake to no more than 1 drink per day for nonpregnant women and 2 drinks per day for men. One drink equals 12 ounces of beer, 5 ounces of wine, or 1 ounces of hard liquor.  Take a multivitamin, if directed by your health care provider. Contact a health care provider if:  Your fatigue does not get better.  You have a fever.  You have unintentional weight loss or gain.  You have headaches.  You have difficulty:  Falling asleep.  Sleeping throughout the night.  You feel angry, guilty, anxious, or sad.  You are unable to have a bowel movement (constipation).  You skin is dry.  Your legs or another part of your body is swollen. Get help right away if:  You feel confused.  Your vision is blurry.  You feel faint or pass out.  You have a severe headache.  You have severe abdominal, pelvic, or back pain.  You have chest pain, shortness of breath, or an  irregular or fast heartbeat.  You are unable to urinate or you urinate less than normal.  You develop abnormal bleeding, such as bleeding from the rectum, vagina, nose, lungs, or nipples.  You vomit blood.  You have thoughts about harming yourself or committing suicide.  You are worried that you might harm someone else. This information is not intended to replace advice given to you by your health care provider. Make sure you discuss any questions you have with your health care provider. Document Released: 12/13/2006 Document Revised: 07/24/2015 Document Reviewed: 06/19/2013  2017 Elsevier  

## 2016-04-26 NOTE — Progress Notes (Signed)
Pre visit review using our clinic review tool, if applicable. No additional management support is needed unless otherwise documented below in the visit note. 

## 2016-04-26 NOTE — Progress Notes (Signed)
Patient ID: Christina Villarreal, female    DOB: 09/30/1980  Age: 36 y.o. MRN: [id number]    Subjective:  Subjective  HPI Christina Villarreal presents for c/o fatigue x 2 months. She stopped ADD meds in that time.   Pt states she is sleeping ok and denies depression.  The last time she felt like this she was anemic.  She states she is not pregnant and is on her period now.     Review of Systems  Constitutional: Positive for fatigue. Negative for activity change, appetite change, chills and fever.  HENT: Negative for congestion, rhinorrhea, sinus pain and sinus pressure.   Respiratory: Negative for cough, shortness of breath and wheezing.   Gastrointestinal: Negative for abdominal distention and abdominal pain.  Genitourinary: Negative for difficulty urinating, dyspareunia, dysuria, flank pain, frequency, genital sores, hematuria, menstrual problem, pelvic pain, urgency, vaginal discharge and vaginal pain.  Musculoskeletal: Negative for back pain.  Neurological: Negative for dizziness, weakness and light-headedness.  Psychiatric/Behavioral: Negative for decreased concentration and dysphoric mood. The patient is not nervous/anxious.     History Past Medical History:  Diagnosis Date  . ADD (attention deficit disorder)   . GERD (gastroesophageal reflux disease)     She has no past surgical history on file.   Her family history is not on file.She reports that she has never smoked. She has never used smokeless tobacco. She reports that she does not drink alcohol or use drugs.  Current Outpatient Prescriptions on File Prior to Visit  Medication Sig Dispense Refill  . diclofenac (VOLTAREN) 75 MG EC tablet Take 1 tablet (75 mg total) by mouth 2 (two) times daily. 30 tablet 0  . methylphenidate (RITALIN) 20 MG tablet Take 1 tablet (20 mg total) by mouth 2 (two) times daily. 60 tablet 0  . valACYclovir (VALTREX) 500 MG tablet Take 500 mg by mouth daily.      No current facility-administered medications on  file prior to visit.      Objective:  Objective  Physical Exam  Constitutional: She is oriented to person, place, and time. She appears well-developed and well-nourished.  HENT:  Head: Normocephalic and atraumatic.  Eyes: Conjunctivae and EOM are normal.  Neck: Normal range of motion. Neck supple. No JVD present. Carotid bruit is not present. No thyromegaly present.  Cardiovascular: Normal rate, regular rhythm and normal heart sounds.   No murmur heard. Pulmonary/Chest: Effort normal and breath sounds normal. No respiratory distress. She has no wheezes. She has no rales. She exhibits no tenderness.  Musculoskeletal: She exhibits no edema.  Neurological: She is alert and oriented to person, place, and time.  Psychiatric: She has a normal mood and affect. Her behavior is normal. Judgment normal.  Nursing note and vitals reviewed.  BP 98/62 (BP Location: Right Arm, Patient Position: Sitting, Cuff Size: Normal)   Pulse 83   Temp 98 F (36.7 C) (Oral)   Resp 16   Ht 5\' 5"  (1.651 m)   Wt 143 lb (64.9 kg)   LMP 04/25/2016 Comment: patient report she is currently on her mestrucal period  SpO2 98%   BMI 23.80 kg/m  Wt Readings from Last 3 Encounters:  04/26/16 143 lb (64.9 kg)  08/13/15 139 lb (63 kg)  07/25/15 137 lb 12.8 oz (62.5 kg)     Lab Results  Component Value Date   WBC 6.6 08/28/2013   HGB 12.8 08/28/2013   HCT 38.2 08/28/2013   PLT 304.0 08/28/2013   GLUCOSE 93 08/28/2013   CHOL  165 08/28/2013   TRIG 58.0 08/28/2013   HDL 66.90 08/28/2013   LDLCALC 87 08/28/2013   ALT 13 08/28/2013   AST 17 08/28/2013   NA 137 08/28/2013   K 3.9 08/28/2013   CL 103 08/28/2013   CREATININE 0.7 08/28/2013   BUN 18 08/28/2013   CO2 28 08/28/2013   TSH 0.97 08/28/2013    Dg Knee 2 Views Right  Result Date: 07/25/2015 CLINICAL DATA:  Right knee pain for 1 week, no known injury, initial encounter EXAM: RIGHT KNEE - 2 VIEW COMPARISON:  None. FINDINGS: No joint effusion is  noted. Some minimal lucency is noted in the medial tibial plateau  which may be projectional in nature. No other soft tissue abnormality is seen. No definitive fracture is seen. IMPRESSION: Minimal lucency in the medial tibial plateau. This is of uncertain significance. No other focal abnormality is noted. Electronically Signed   By: Alcide Clever M.D.   On: 07/25/2015 16:32     Assessment & Plan:  Plan  I have discontinued Ms. Frericks's azithromycin and fluticasone. I am also having her maintain her valACYclovir, methylphenidate, and diclofenac.  No orders of the defined types were placed in this encounter.   Problem List Items Addressed This Visit    None    Visit Diagnoses    Fatigue, unspecified type    -  Primary   Relevant Orders   TSH   Vitamin B12   CBC with Differential/Platelet   Comprehensive metabolic panel   POCT Urinalysis Dipstick (Automated)   Vitamin D (25 hydroxy)   IBC panel   Ferritin    take MVI daily and start iron supplement -- we will have labs back in a day or so rto prn   Follow-up: No Follow-up on file.  [name], DO

## 2016-04-27 LAB — URINE CULTURE

## 2016-06-30 ENCOUNTER — Ambulatory Visit (INDEPENDENT_AMBULATORY_CARE_PROVIDER_SITE_OTHER): Payer: BC Managed Care – PPO | Admitting: Family Medicine

## 2016-06-30 ENCOUNTER — Encounter: Payer: Self-pay | Admitting: Family Medicine

## 2016-06-30 VITALS — BP 116/69 | HR 77 | Temp 98.5°F | Ht 65.0 in | Wt 139.4 lb

## 2016-06-30 DIAGNOSIS — R599 Enlarged lymph nodes, unspecified: Secondary | ICD-10-CM | POA: Diagnosis not present

## 2016-06-30 DIAGNOSIS — Z20818 Contact with and (suspected) exposure to other bacterial communicable diseases: Secondary | ICD-10-CM

## 2016-06-30 LAB — POCT RAPID STREP A (OFFICE): Rapid Strep A Screen: NEGATIVE

## 2016-06-30 NOTE — Patient Instructions (Signed)
We are going to culture your throat. If anything grows by Friday, we will let you know and call in antibiotics.  Let us know if anything changes.

## 2016-06-30 NOTE — Progress Notes (Signed)
Pre visit review using our clinic tool,if applicable. No additional management support is needed unless otherwise documented below in the visit note.  

## 2016-06-30 NOTE — Progress Notes (Signed)
SUBJECTIVE:   Christina Villarreal is a 36 y.o. female presents to the clinic for:  Chief Complaint  Patient presents with  . Sore Throat    with swollen glands/Family has strep    Complains of sore throat for 1 weeks.  Other associated symptoms: had sore throat around 1 week ago, swollen glands today.  Denies: sinus congestion, sinus pain, rhinorrhea, itchy watery eyes, ear pain, ear drainage, sore throat, wheezing, shortness of breath, myalgia and fevers Sick Contacts: contacts w/ similar symptoms and strep Therapy to date: none  History  Smoking Status  . Never Smoker  Smokeless Tobacco  . Never Used    ROS: Pertinent items are noted in HPI  Patient's medications, allergies, past medical, surgical, social and family histories were reviewed and updated as appropriate.  OBJECTIVE:  BP 116/69   Pulse 77   Temp 98.5 F (36.9 C) (Oral)   Ht  (1.651 m)   Wt 139 lb 6.4 oz (63.2 kg)   LMP 06/23/2016   SpO2 100%   BMI 23.20 kg/m  General: Awake, alert, appearing stated age Eyes: conjunctivae and sclerae clear Ears: normal TMs bilaterally Nose: no visible exudate Oropharynx: lips, mucosa, and tongue normal; teeth and gums normal, tonsils and pharynx unremarkable. Neck: supple, no significant adenopathy, no TTP Lungs: clear to auscultation, no wheezes, rales or rhonchi, symmetric air entry, normal effort Heart: rate and rhythm regular Skin:reveals no rash Psych: Age appropriate judgment and insight  ASSESSMENT/PLAN:  Glands swollen  Exposure to strep throat  Rapid strep neg. Will culture given hx of strep exposure and for peace of mind.  Let us know if anything changes in meantime. F/u prn. Pt voiced understanding and agreement to the plan.  Jilda Roche [place], DO 06/30/16 8:59 AM

## 2016-06-30 NOTE — Addendum Note (Signed)
Addended by: Verdie Shire on: 06/30/2016 09:13 AM   Modules accepted: Orders

## 2016-07-01 LAB — CULTURE, GROUP A STREP

## 2016-07-30 ENCOUNTER — Ambulatory Visit (INDEPENDENT_AMBULATORY_CARE_PROVIDER_SITE_OTHER): Payer: BC Managed Care – PPO | Admitting: Internal Medicine

## 2016-07-30 ENCOUNTER — Encounter: Payer: Self-pay | Admitting: Internal Medicine

## 2016-07-30 VITALS — BP 112/78 | HR 94 | Ht 64.0 in | Wt 137.0 lb

## 2016-07-30 DIAGNOSIS — J029 Acute pharyngitis, unspecified: Secondary | ICD-10-CM | POA: Diagnosis not present

## 2016-07-30 LAB — POCT RAPID STREP A (OFFICE): Rapid Strep A Screen: NEGATIVE

## 2016-07-30 MED ORDER — AZITHROMYCIN 250 MG PO TABS: ORAL_TABLET | ORAL | 1 refills | Status: DC

## 2016-07-30 NOTE — Progress Notes (Signed)
   Subjective:    Patient ID: Christina Villarreal, female    DOB: 10/27/1980, 36 y.o.   MRN: [id number]  HPI  Here to f/u with c/o ? Strep throat with related diarrhea symptoms; daughter has been ill first with 4 -5 days ST and diarrhea - had rapid strep neg at pediatricians office, but pt (mom) just notified today of + strep cx and rx antibiotics.  Pt now with very similar sz with severe ST not improved, and numerous episodes loose stooling and crampy abd pains, without blood or vomiting.  Did have some mild nausea but overall not a prominent symptoms.   Past Medical History:  Diagnosis Date  . ADD (attention deficit disorder)   . GERD (gastroesophageal reflux disease)    No past surgical history on file.  reports that she has never smoked. She has never used smokeless tobacco. She reports that she does not drink alcohol or use drugs. family history is not on file. No Known Allergies Current Outpatient Prescriptions on File Prior to Visit  Medication Sig Dispense Refill  . diclofenac (VOLTAREN) 75 MG EC tablet Take 1 tablet (75 mg total) by mouth 2 (two) times daily. 30 tablet 0  . methylphenidate (RITALIN) 20 MG tablet Take 1 tablet (20 mg total) by mouth 2 (two) times daily. 60 tablet 0  . valACYclovir (VALTREX) 500 MG tablet Take 500 mg by mouth daily.      No current facility-administered medications on file prior to visit.    Review of Systems All otherwise neg per pt    Objective:   Physical Exam BP 112/78   Pulse 94   Ht 5\' 4"  (1.626 m)   Wt 137 lb (62.1 kg)   SpO2 99%   BMI 23.52 kg/m  VS noted, mild ill appearing Constitutional: Pt appears in NAD HENT: Head: NCAT.  Right Ear: External ear normal.  Left Ear: External ear normal.  Eyes: . Pupils are equal, round, and reactive to light. Conjunctivae and EOM are normal Nose: without d/c or deformity Bilat tm's with mild erythema.  Max sinus areas mild tender.  Pharynx with mod to severe erythema and mild swelling, no  exudate Neck: Neck supple. Gross normal ROM but with bilat submandibular LA tender Cardiovascular: Normal rate and regular rhythm.   Pulmonary/Chest: Effort normal and breath sounds without rales or wheezing.  Abd:  Soft, NT, ND, + BS, no organomegaly Neurological: Pt is alert. At baseline orientation, motor grossly intact Skin: Skin is warm. No rashes, other new lesions, no LE edema Psychiatric: Pt behavior is normal without agitation  No other exam findings   POCT rapid strep A  Order: [id number]  Status:  Final result Visible to patient:  No (Not Released) Dx:  Sore throat    Ref Range & Units 12:08 73mo ago 173mo ago 5533yr ago   Rapid Strep A Screen Negative Negative  Negative  Negative  Negative            Assessment & Plan:

## 2016-07-30 NOTE — Assessment & Plan Note (Signed)
Pt with culture proven strep pharyngitis exposure, now with similar symptoms.  Rapid strep neg, but will need to tx given obvious exposure - for antibx course,  to f/u any worsening symptoms or concerns

## 2016-07-30 NOTE — Patient Instructions (Signed)
Please take all new medication as prescribed - the antibiotic  Please continue all other medications as before, and refills have been done if requested.  Please have the pharmacy call with any other refills you may need.  Please keep your appointments with your specialists as you may have planned   

## 2016-11-26 LAB — HM PAP SMEAR: HM Pap smear: NEGATIVE

## 2017-08-27 MED ORDER — GENERIC EXTERNAL MEDICATION
.50 | Status: DC
Start: ? — End: 2017-08-27

## 2017-08-27 MED ORDER — LACTATED RINGERS IV SOLN
INTRAVENOUS | Status: DC
Start: ? — End: 2017-08-27

## 2017-08-27 MED ORDER — ONDANSETRON HCL 4 MG/2ML IJ SOLN
4.00 | INTRAMUSCULAR | Status: DC
Start: ? — End: 2017-08-27

## 2017-08-27 MED ORDER — IRON PO
1.00 | ORAL | Status: DC
Start: 2017-08-27 — End: 2017-08-27

## 2017-08-27 MED ORDER — VALACYCLOVIR HCL 500 MG PO TABS
500.00 | ORAL_TABLET | ORAL | Status: DC
Start: 2017-08-27 — End: 2017-08-27

## 2017-08-27 MED ORDER — SODIUM CHLORIDE 0.9 % IJ SOLN
10.00 | INTRAMUSCULAR | Status: DC
Start: 2017-08-27 — End: 2017-08-27

## 2017-08-27 MED ORDER — GENERIC EXTERNAL MEDICATION
0.50 | Status: DC
Start: ? — End: 2017-08-27

## 2017-08-27 MED ORDER — TETANUS-DIPHTH-ACELL PERTUSSIS 5-2.5-18.5 LF-MCG/0.5 IM SUSP
0.50 | INTRAMUSCULAR | Status: DC
Start: ? — End: 2017-08-27

## 2017-08-27 MED ORDER — GENERIC EXTERNAL MEDICATION
Status: DC
Start: ? — End: 2017-08-27

## 2017-08-27 MED ORDER — OXYCODONE-ACETAMINOPHEN 5-325 MG PO TABS
1.00 | ORAL_TABLET | ORAL | Status: DC
Start: ? — End: 2017-08-27

## 2017-08-27 MED ORDER — ALUMINUM-MAGNESIUM-SIMETHICONE 200-200-20 MG/5ML PO SUSP
30.00 | ORAL | Status: DC
Start: ? — End: 2017-08-27

## 2017-08-27 MED ORDER — BENZOCAINE-MENTHOL 20-0.5 % EX AERO
1.00 | INHALATION_SPRAY | CUTANEOUS | Status: DC
Start: ? — End: 2017-08-27

## 2017-08-27 MED ORDER — DIPHENHYDRAMINE HCL 25 MG PO CAPS
50.00 | ORAL_CAPSULE | ORAL | Status: DC
Start: ? — End: 2017-08-27

## 2017-08-27 MED ORDER — ACETAMINOPHEN 325 MG PO TABS
650.00 | ORAL_TABLET | ORAL | Status: DC
Start: ? — End: 2017-08-27

## 2017-08-27 MED ORDER — NALBUPHINE HCL 10 MG/ML IJ SOLN
2.50 | INTRAMUSCULAR | Status: DC
Start: ? — End: 2017-08-27

## 2017-08-27 MED ORDER — MEASLES, MUMPS & RUBELLA VAC ~~LOC~~ INJ
0.50 | INJECTION | SUBCUTANEOUS | Status: DC
Start: ? — End: 2017-08-27

## 2017-08-27 MED ORDER — MAGNESIUM HYDROXIDE 400 MG/5ML PO SUSP
30.00 | ORAL | Status: DC
Start: ? — End: 2017-08-27

## 2017-08-27 MED ORDER — EPHEDRINE SULFATE 50 MG/ML IJ SOLN
5.00 | INTRAMUSCULAR | Status: DC
Start: ? — End: 2017-08-27

## 2017-08-27 MED ORDER — DIPHENHYDRAMINE HCL 25 MG PO CAPS
25.00 | ORAL_CAPSULE | ORAL | Status: DC
Start: ? — End: 2017-08-27

## 2017-08-27 MED ORDER — SIMETHICONE 80 MG PO CHEW
80.00 | CHEWABLE_TABLET | ORAL | Status: DC
Start: ? — End: 2017-08-27

## 2017-08-27 MED ORDER — NALOXONE HCL 0.4 MG/ML IJ SOLN
.10 | INTRAMUSCULAR | Status: DC
Start: ? — End: 2017-08-27

## 2017-08-27 MED ORDER — DOCUSATE SODIUM 100 MG PO CAPS
100.00 | ORAL_CAPSULE | ORAL | Status: DC
Start: ? — End: 2017-08-27

## 2017-08-27 MED ORDER — IBUPROFEN 400 MG PO TABS
800.00 | ORAL_TABLET | ORAL | Status: DC
Start: 2017-08-27 — End: 2017-08-27

## 2017-08-27 MED ORDER — GENERIC EXTERNAL MEDICATION
5.00 | Status: DC
Start: ? — End: 2017-08-27

## 2017-08-27 MED ORDER — BUTORPHANOL TARTRATE 1 MG/ML IJ SOLN
1.00 | INTRAMUSCULAR | Status: DC
Start: ? — End: 2017-08-27

## 2017-08-27 MED ORDER — HYDROCODONE-ACETAMINOPHEN 5-325 MG PO TABS
1.00 | ORAL_TABLET | ORAL | Status: DC
Start: ? — End: 2017-08-27

## 2017-08-27 MED ORDER — SODIUM CHLORIDE 0.9 % IJ SOLN
10.00 | INTRAMUSCULAR | Status: DC
Start: ? — End: 2017-08-27

## 2017-12-20 ENCOUNTER — Encounter: Payer: Self-pay | Admitting: Family Medicine

## 2017-12-20 ENCOUNTER — Telehealth: Payer: Self-pay

## 2017-12-20 ENCOUNTER — Ambulatory Visit: Payer: BC Managed Care – PPO | Admitting: Family Medicine

## 2017-12-20 VITALS — BP 122/78 | HR 78 | Temp 98.4°F | Resp 16 | Ht 65.0 in | Wt 142.0 lb

## 2017-12-20 DIAGNOSIS — Z23 Encounter for immunization: Secondary | ICD-10-CM

## 2017-12-20 DIAGNOSIS — O99345 Other mental disorders complicating the puerperium: Secondary | ICD-10-CM | POA: Diagnosis not present

## 2017-12-20 DIAGNOSIS — F53 Postpartum depression: Secondary | ICD-10-CM | POA: Diagnosis not present

## 2017-12-20 NOTE — Progress Notes (Signed)
Patient ID: Christina Villarreal, female    DOB: February 19, 1981  Age: 37 y.o. MRN: [id number]    Subjective:  Subjective  HPI Christina Villarreal presents for f/u anxiety.  She would like to restart her Lexapro but she is breast feeding -- she had a baby In June.   [facility].  Dr Christina Villarreal delivered the baby.    Review of Systems  Constitutional: Negative for activity change, appetite change, chills and fever.  Gastrointestinal: Negative for abdominal distention and abdominal pain.  Genitourinary: Negative for difficulty urinating, dyspareunia, dysuria, flank pain, frequency, genital sores, hematuria, menstrual problem, pelvic pain, urgency, vaginal discharge and vaginal pain.  Musculoskeletal: Negative for back pain.  Psychiatric/Behavioral: Positive for decreased concentration, dysphoric mood and sleep disturbance. Negative for self-injury and suicidal ideas. The patient is nervous/anxious.     History Past Medical History:  Diagnosis Date  . ADD (attention deficit disorder)   . GERD (gastroesophageal reflux disease)     She has no past surgical history on file.   Her family history is not on file.She reports that she has never smoked. She has never used smokeless tobacco. She reports that she does not drink alcohol or use drugs.  Current Outpatient Medications on File Prior to Visit  Medication Sig Dispense Refill  . Prenatal Vit-Fe Fumarate-FA (PRENATAL VITAMIN PO) Take 1 tablet by mouth daily.    . valACYclovir (VALTREX) 500 MG tablet Take 500 mg by mouth daily.      No current facility-administered medications on file prior to visit.      Objective:  Objective  Physical Exam  Constitutional: She is oriented to person, place, and time. She appears well-developed and well-nourished.  HENT:  Head: Normocephalic and atraumatic.  Eyes: Conjunctivae and EOM are normal.  Neck: Normal range of motion. Neck supple. No JVD present. Carotid bruit is not present. No  thyromegaly present.  Cardiovascular: Normal rate, regular rhythm and normal heart sounds.  No murmur heard. Pulmonary/Chest: Effort normal and breath sounds normal. No respiratory distress. She has no wheezes. She has no rales. She exhibits no tenderness.  Musculoskeletal: She exhibits no edema.  Neurological: She is alert and oriented to person, place, and time.  Psychiatric: Her behavior is normal. Thought content normal. Her mood appears anxious. Cognition and memory are normal. She exhibits a depressed mood.  Nursing note and vitals reviewed.  BP 122/78 (BP Location: Left Arm, Cuff Size: Normal)   Pulse 78   Temp 98.4 F (36.9 C) (Oral)   Resp 16   Ht 5\' 5"  (1.651 m)   Wt 142 lb (64.4 kg)   SpO2 98%   BMI 23.63 kg/m  Wt Readings from Last 3 Encounters:  12/20/17 142 lb (64.4 kg)  07/30/16 137 lb (62.1 kg)  06/30/16 139 lb 6.4 oz (63.2 kg)     Lab Results  Component Value Date   WBC 8.6 04/26/2016   HGB 14.1 04/26/2016   HCT 41.4 04/26/2016   PLT 334.0 04/26/2016   GLUCOSE 88 04/26/2016   CHOL 165 08/28/2013   TRIG 58.0 08/28/2013   HDL 66.90 08/28/2013   LDLCALC 87 08/28/2013   ALT 14 04/26/2016   AST 20 04/26/2016   NA 135 04/26/2016   K 4.3 04/26/2016   CL 101 04/26/2016   CREATININE 0.75 04/26/2016   BUN 15 04/26/2016   CO2 26 04/26/2016   TSH 3.10 04/26/2016    Dg Knee 2 Views Right  Result Date: 07/25/2015 CLINICAL DATA:  Right knee pain for 1 week, no known injury, initial encounter EXAM: RIGHT KNEE - 2 VIEW COMPARISON:  None. FINDINGS: No joint effusion is noted. Some minimal lucency is noted in the medial tibial plateau which may be projectional in nature. No other soft tissue abnormality is seen. No definitive fracture is seen. IMPRESSION: Minimal lucency in the medial tibial plateau. This is of uncertain significance. No other focal abnormality is noted. Electronically Signed   By: Alcide Clever M.D.   On: 07/25/2015 16:32     Assessment & Plan:    Plan  I have discontinued Christina Villarreal's methylphenidate, diclofenac, and azithromycin. I am also having her maintain her valACYclovir and Prenatal Vit-Fe Fumarate-FA (PRENATAL VITAMIN PO).  No orders of the defined types were placed in this encounter.   Problem List Items Addressed This Visit      Unprioritized   Postpartum depression - Primary    Could probably restart lexapro but waiting for ok from OB       Other Visit Diagnoses    Influenza vaccine administered       Relevant Orders   Flu Vaccine QUAD 6+ mos PF IM (Fluarix Quad PF) (Completed)      Follow-up: Return in about 4 weeks (around 01/17/2018), or if symptoms worsen or fail to improve.  Christina Schultz, DO

## 2017-12-20 NOTE — Telephone Encounter (Signed)
Author phoned [facility], and was transferred to triage nurse VM. Author left detailed VM regarding Dr. Ernst Spell question about re-starting lexapro 10mg  per pt. Request in light of the fact that pt. Is breastfeeding. Awaiting call back from Dr. Orlena Sheldon office.

## 2017-12-20 NOTE — Patient Instructions (Signed)
Postpartum Depression and Baby Blues The postpartum period begins right after the birth of a baby. During this time, there is often a great amount of joy and excitement. It is also a time of many changes in the life of the parents. Regardless of how many times a mother gives birth, each child brings new challenges and dynamics to the family. It is not unusual to have feelings of excitement along with confusing shifts in moods, emotions, and thoughts. All mothers are at risk of developing postpartum depression or the "baby blues." These mood changes can occur right after giving birth, or they may occur many months after giving birth. The baby blues or postpartum depression can be mild or severe. Additionally, postpartum depression can go away rather quickly, or it can be a long-term condition. What are the causes? Raised hormone levels and the rapid drop in those levels are thought to be a main cause of postpartum depression and the baby blues. A number of hormones change during and after pregnancy. Estrogen and progesterone usually decrease right after the delivery of your baby. The levels of thyroid hormone and various cortisol steroids also rapidly drop. Other factors that play a role in these mood changes include major life events and genetics. What increases the risk? If you have any of the following risks for the baby blues or postpartum depression, know what symptoms to watch out for during the postpartum period. Risk factors that may increase the likelihood of getting the baby blues or postpartum depression include:  Having a personal or family history of depression.  Having depression while being pregnant.  Having premenstrual mood issues or mood issues related to oral contraceptives.  Having a lot of life stress.  Having marital conflict.  Lacking a social support network.  Having a baby with special needs.  Having health problems, such as diabetes.  What are the signs or  symptoms? Symptoms of baby blues include:  Brief changes in mood, such as going from extreme happiness to sadness.  Decreased concentration.  Difficulty sleeping.  Crying spells, tearfulness.  Irritability.  Anxiety.  Symptoms of postpartum depression typically begin within the first month after giving birth. These symptoms include:  Difficulty sleeping or excessive sleepiness.  Marked weight loss.  Agitation.  Feelings of worthlessness.  Lack of interest in activity or food.  Postpartum psychosis is a very serious condition and can be dangerous. Fortunately, it is rare. Displaying any of the following symptoms is cause for immediate medical attention. Symptoms of postpartum psychosis include:  Hallucinations and delusions.  Bizarre or disorganized behavior.  Confusion or disorientation.  How is this diagnosed? A diagnosis is made by an evaluation of your symptoms. There are no medical or lab tests that lead to a diagnosis, but there are various questionnaires that a health care provider may use to identify those with the baby blues, postpartum depression, or psychosis. Often, a screening tool called the Edinburgh Postnatal Depression Scale is used to diagnose depression in the postpartum period. How is this treated? The baby blues usually goes away on its own in 1-2 weeks. Social support is often all that is needed. You will be encouraged to get adequate sleep and rest. Occasionally, you may be given medicines to help you sleep. Postpartum depression requires treatment because it can last several months or longer if it is not treated. Treatment may include individual or group therapy, medicine, or both to address any social, physiological, and psychological factors that may play a role in the   depression. Regular exercise, a healthy diet, rest, and social support may also be strongly recommended. Postpartum psychosis is more serious and needs treatment right away.  Hospitalization is often needed. Follow these instructions at home:  Get as much rest as you can. Nap when the baby sleeps.  Exercise regularly. Some women find yoga and walking to be beneficial.  Eat a balanced and nourishing diet.  Do little things that you enjoy. Have a cup of tea, take a bubble bath, read your favorite magazine, or listen to your favorite music.  Avoid alcohol.  Ask for help with household chores, cooking, grocery shopping, or running errands as needed. Do not try to do everything.  Talk to people close to you about how you are feeling. Get support from your partner, family members, friends, or other new moms.  Try to stay positive in how you think. Think about the things you are grateful for.  Do not spend a lot of time alone.  Only take over-the-counter or prescription medicine as directed by your health care provider.  Keep all your postpartum appointments.  Let your health care provider know if you have any concerns. Contact a health care provider if: You are having a reaction to or problems with your medicine. Get help right away if:  You have suicidal feelings.  You think you may harm the baby or someone else. This information is not intended to replace advice given to you by your health care provider. Make sure you discuss any questions you have with your health care provider. Document Released: 11/20/2003 Document Revised: 07/24/2015 Document Reviewed: 11/27/2012 Elsevier Interactive Patient Education  2017 Elsevier Inc.  

## 2017-12-20 NOTE — Telephone Encounter (Signed)
Thank you :)

## 2017-12-20 NOTE — Assessment & Plan Note (Signed)
Could probably restart lexapro but waiting for ok from [facility]

## 2017-12-26 ENCOUNTER — Telehealth: Payer: Self-pay | Admitting: *Deleted

## 2017-12-26 NOTE — Telephone Encounter (Signed)
Received Medical records from [facility]; forwarded to provider/SLS 10/28

## 2017-12-27 ENCOUNTER — Encounter: Payer: Self-pay | Admitting: Family Medicine

## 2018-03-15 DIAGNOSIS — E0590 Thyrotoxicosis, unspecified without thyrotoxic crisis or storm: Secondary | ICD-10-CM | POA: Diagnosis not present

## 2018-11-08 ENCOUNTER — Telehealth: Payer: BC Managed Care – PPO | Admitting: Family

## 2018-11-08 DIAGNOSIS — Z20822 Contact with and (suspected) exposure to covid-19: Secondary | ICD-10-CM

## 2018-11-08 NOTE — Progress Notes (Signed)
E-Visit for Corona Virus Screening   Your current symptoms could be consistent with the coronavirus.  Many health care providers can now test patients at their office but not all are.  [facility] has multiple testing sites. For information on our COVID testing locations and hours go to [email or web address]  Please quarantine yourself while awaiting your test results.  We are enrolling you in our MyChart Home Montioring for COVID19 . Daily you will receive a questionnaire within the MyChart website. Our COVID 19 response team willl be monitoriing your responses daily.  You can go to one of the  testing sites listed below, while they are opened (see hours). You do not need an order and will stay in your car during the test. You do need to self isolate until your results return and if positive 14 days from when your symptoms started and until you are 3 days symptom free.   Testing Locations (Monday - Friday, 8 a.m. - 3:30 p.m.) . [place]: [facility], [address], [place], [place]  . [place]: [facility], [address], [place], [place] (entrance [address])  . [place]: (Closed each Monday): Testing site relocated to the short stay covered drive at [facility]. (Use the [address] entrance to [facility] next to [facility].) Approximately 5 minutes was spent documenting and reviewing patient's chart.    COVID-19 is a respiratory illness with symptoms that are similar to the flu. Symptoms are typically mild to moderate, but there have been cases of severe illness and death due to the virus. The following symptoms may appear 2-14 days after exposure: . Fever . Cough . Shortness of breath or difficulty breathing . Chills . Repeated shaking with chills . Muscle pain . Headache . Sore throat . New loss of taste or smell . Fatigue . Congestion or runny  nose . Nausea or vomiting . Diarrhea  It is vitally important that if you feel that you have an infection such as this virus or any other virus that you stay home and away from places where you may spread it to others.  You should self-quarantine for 14 days if you have symptoms that could potentially be coronavirus or have been in close contact a with a person diagnosed with COVID-19 within the last 2 weeks. You should avoid contact with people age 65 and older.   You should wear a mask or cloth face covering over your nose and mouth if you must be around other people or animals, including pets (even at home). Try to stay at least 6 feet away from other people. This will protect the people around you.   You may also take acetaminophen (Tylenol) as needed for fever.   Reduce your risk of any infection by using the same precautions used for avoiding the common cold or flu:  . Wash your hands often with soap and warm water for at least 20 seconds.  If soap and water are not readily available, use an alcohol-based hand sanitizer with at least 60% alcohol.  . If coughing or sneezing, cover your mouth and nose by coughing or sneezing into the elbow areas of your shirt or coat, into a tissue or into your sleeve (not your hands). . Avoid shaking hands with others and consider head nods or verbal greetings only. . Avoid touching your eyes, nose, or mouth with unwashed hands.  . Avoid close contact with people who   are sick. . Avoid places or events with large numbers of people in one location, like concerts or sporting events. . Carefully consider travel plans you have or are making. . If you are planning any travel outside or inside the [place], visit the CDC's Travelers' Health webpage for the latest health notices. . If you have some symptoms but not all symptoms, continue to monitor at home and seek medical attention if your symptoms worsen. . If you are having a medical emergency, call 911.  HOME  CARE . Only take medications as instructed by your medical team. . Drink plenty of fluids and get plenty of rest. . A steam or ultrasonic humidifier can help if you have congestion.   GET HELP RIGHT AWAY IF YOU HAVE EMERGENCY WARNING SIGNS** FOR COVID-19. If you or someone is showing any of these signs seek emergency medical care immediately. Call 911 or proceed to your closest emergency facility if: . You develop worsening high fever. . Trouble breathing . Bluish lips or face . Persistent pain or pressure in the chest . New confusion . Inability to wake or stay awake . You cough up blood. . Your symptoms become more severe  **This list is not all possible symptoms. Contact your medical provider for any symptoms that are sever or concerning to you.   MAKE SURE YOU   Understand these instructions.  Will watch your condition.  Will get help right away if you are not doing well or get worse.  Your e-visit answers were reviewed by a board certified advanced clinical practitioner to complete your personal care plan.  Depending on the condition, your plan could have included both over the counter or prescription medications.  If there is a problem please reply once you have received a response from your provider.  Your safety is important to us.  If you have drug allergies check your prescription carefully.    You can use MyChart to ask questions about today's visit, request a non-urgent call back, or ask for a work or school excuse for 24 hours related to this e-Visit. If it has been greater than 24 hours you will need to follow up with your provider, or enter a new e-Visit to address those concerns. You will get an e-mail in the next two days asking about your experience.  I hope that your e-visit has been valuable and will speed your recovery. Thank you for using e-visits.    

## 2018-11-09 ENCOUNTER — Encounter (INDEPENDENT_AMBULATORY_CARE_PROVIDER_SITE_OTHER): Payer: Self-pay

## 2019-02-16 ENCOUNTER — Other Ambulatory Visit: Payer: Self-pay

## 2019-02-16 DIAGNOSIS — Z20822 Contact with and (suspected) exposure to covid-19: Secondary | ICD-10-CM

## 2019-11-06 ENCOUNTER — Encounter (HOSPITAL_BASED_OUTPATIENT_CLINIC_OR_DEPARTMENT_OTHER): Payer: BC Managed Care – PPO

## 2019-11-20 ENCOUNTER — Ambulatory Visit: Payer: BC Managed Care – PPO | Admitting: Family Medicine

## 2019-12-03 ENCOUNTER — Ambulatory Visit: Payer: BC Managed Care – PPO | Admitting: Family Medicine

## 2019-12-17 ENCOUNTER — Ambulatory Visit: Payer: BC Managed Care – PPO | Admitting: Family Medicine

## 2019-12-17 ENCOUNTER — Encounter: Payer: Self-pay | Admitting: Family Medicine

## 2019-12-17 ENCOUNTER — Other Ambulatory Visit: Payer: Self-pay

## 2019-12-17 VITALS — BP 110/60 | HR 88 | Temp 98.2°F | Resp 18 | Ht 65.0 in | Wt 137.0 lb

## 2019-12-17 DIAGNOSIS — Z79899 Other long term (current) drug therapy: Secondary | ICD-10-CM

## 2019-12-17 DIAGNOSIS — R41840 Attention and concentration deficit: Secondary | ICD-10-CM

## 2019-12-17 MED ORDER — LISDEXAMFETAMINE DIMESYLATE 20 MG PO CAPS: 20.0000 mg | ORAL_CAPSULE | Freq: Every day | ORAL | 0 refills | Status: DC

## 2019-12-17 NOTE — Patient Instructions (Signed)

## 2019-12-17 NOTE — Progress Notes (Signed)
Patient ID: Christina Villarreal, female    DOB: 1980-10-03  Age: 39 y.o. MRN: [id number]    Subjective:  Subjective  HPI Christina Villarreal presents for f/u add  She has been off her meds since she got pregnant and then she was breast feeding.  She would like to try something other than ritalin   Review of Systems  Constitutional: Negative for appetite change, diaphoresis, fatigue and unexpected weight change.  Eyes: Negative for pain, redness and visual disturbance.  Respiratory: Negative for cough, chest tightness, shortness of breath and wheezing.   Cardiovascular: Negative for chest pain, palpitations and leg swelling.  Endocrine: Negative for cold intolerance, heat intolerance, polydipsia, polyphagia and polyuria.  Genitourinary: Negative for difficulty urinating, dysuria and frequency.  Neurological: Negative for dizziness, light-headedness, numbness and headaches.  Psychiatric/Behavioral: Positive for decreased concentration.    History Past Medical History:  Diagnosis Date  . ADD (attention deficit disorder)   . GERD (gastroesophageal reflux disease)     She has no past surgical history on file.   Her family history is not on file.She reports that she has never smoked. She has never used smokeless tobacco. She reports that she does not drink alcohol and does not use drugs.  Current Outpatient Medications on File Prior to Visit  Medication Sig Dispense Refill  . valACYclovir (VALTREX) 500 MG tablet Take 500 mg by mouth daily.     . Prenatal Vit-Fe Fumarate-FA (PRENATAL VITAMIN PO) Take 1 tablet by mouth daily. (Patient not taking: Reported on 12/17/2019)     No current facility-administered medications on file prior to visit.     Objective:  Objective  Physical Exam Vitals and nursing note reviewed.  Constitutional:      Appearance: She is well-developed.  HENT:     Right Ear: External ear normal.     Left Ear: External ear normal.  Eyes:     General:        Right eye: No  discharge.        Left eye: No discharge.     Conjunctiva/sclera: Conjunctivae normal.  Cardiovascular:     Rate and Rhythm: Normal rate and regular rhythm.     Heart sounds: Normal heart sounds. No murmur heard.   Pulmonary:     Effort: Pulmonary effort is normal. No respiratory distress.     Breath sounds: Normal breath sounds. No wheezing or rales.  Chest:     Chest wall: No tenderness.  Lymphadenopathy:     Cervical: No cervical adenopathy.  Neurological:     Mental Status: She is alert and oriented to person, place, and time.   +BP 110/60 (BP Location: Right Arm, Patient Position: Sitting, Cuff Size: Normal)   Pulse 88   Temp 98.2 F (36.8 C) (Oral)   Resp 18   Ht 5\' 5"  (1.651 m)   Wt 137 lb (62.1 kg)   SpO2 99%   BMI 22.80 kg/m  Wt Readings from Last 3 Encounters:  12/17/19 137 lb (62.1 kg)  12/20/17 142 lb (64.4 kg)  07/30/16 137 lb (62.1 kg)     Lab Results  Component Value Date   WBC 8.6 04/26/2016   HGB 14.1 04/26/2016   HCT 41.4 04/26/2016   PLT 334.0 04/26/2016   GLUCOSE 88 04/26/2016   CHOL 165 08/28/2013   TRIG 58.0 08/28/2013   HDL 66.90 08/28/2013   LDLCALC 87 08/28/2013   ALT 14 04/26/2016   AST 20 04/26/2016   NA 135 04/26/2016   K  4.3 04/26/2016   CL 101 04/26/2016   CREATININE 0.75 04/26/2016   BUN 15 04/26/2016   CO2 26 04/26/2016   TSH 3.10 04/26/2016    DG Knee 2 Views Right  Result Date: 07/25/2015 CLINICAL DATA:  Right knee pain for 1 week, no known injury, initial encounter EXAM: RIGHT KNEE - 2 VIEW COMPARISON:  None. FINDINGS: No joint effusion is noted. Some minimal lucency is noted in the medial tibial plateau which may be projectional in nature. No other soft tissue abnormality is seen. No definitive fracture is seen. IMPRESSION: Minimal lucency in the medial tibial plateau. This is of uncertain significance. No other focal abnormality is noted. Electronically Signed   By: Alcide Clever M.D.   On: 07/25/2015 16:32       Assessment & Plan:  Plan  I am having Christina Villarreal start on lisdexamfetamine. I am also having her maintain her valACYclovir and Prenatal Vit-Fe Fumarate-FA (PRENATAL VITAMIN PO).  Meds ordered this encounter  Medications  . lisdexamfetamine (VYVANSE) 20 MG capsule    Sig: Take 1 capsule (20 mg total) by mouth daily.    Dispense:  30 capsule    Refill:  0    Problem List Items Addressed This Visit    None    Visit Diagnoses    High risk medication use    -  Primary   Relevant Orders   DRUG MONITORING, PANEL 8 WITH CONFIRMATION, URINE   Attention and concentration deficit       Relevant Medications   lisdexamfetamine (VYVANSE) 20 MG capsule   Other Relevant Orders   DRUG MONITORING, PANEL 8 WITH CONFIRMATION, URINE    f/u 1 month or sooner prn  uds and contract updated   Follow-up: Return in about 4 weeks (around 01/14/2020), or add.  [name], DO

## 2019-12-19 LAB — DRUG MONITORING, PANEL 8 WITH CONFIRMATION, URINE
6 Acetylmorphine: NEGATIVE ng/mL (ref <10)
Alcohol Metabolites: POSITIVE ng/mL — AB
Amphetamines: NEGATIVE ng/mL (ref <500)
Benzodiazepines: NEGATIVE ng/mL (ref <100)
Buprenorphine, Urine: NEGATIVE ng/mL (ref <5)
Cocaine Metabolite: NEGATIVE ng/mL (ref <150)
Creatinine: 113.3 mg/dL
Ethyl Glucuronide (ETG): 12920 ng/mL — ABNORMAL HIGH (ref <500)
Ethyl Sulfate (ETS): 2789 ng/mL — ABNORMAL HIGH (ref <100)
MDMA: NEGATIVE ng/mL (ref <500)
Marijuana Metabolite: NEGATIVE ng/mL (ref <20)
Opiates: NEGATIVE ng/mL (ref <100)
Oxidant: NEGATIVE ug/mL
Oxycodone: NEGATIVE ng/mL (ref <100)
pH: 6.4 (ref 4.5–9.0)

## 2019-12-19 LAB — DM TEMPLATE

## 2019-12-21 ENCOUNTER — Encounter: Payer: Self-pay | Admitting: Family Medicine

## 2019-12-21 NOTE — Telephone Encounter (Signed)
No --- it was not flagged as abnormal in the urine drug screen Alcohol was

## 2019-12-21 NOTE — Telephone Encounter (Signed)
Please advise 

## 2019-12-24 NOTE — Telephone Encounter (Signed)
Spoke with patient this morning to discuss lab results.

## 2019-12-31 ENCOUNTER — Encounter: Payer: Self-pay | Admitting: Family Medicine

## 2019-12-31 ENCOUNTER — Other Ambulatory Visit: Payer: Self-pay

## 2019-12-31 ENCOUNTER — Ambulatory Visit (INDEPENDENT_AMBULATORY_CARE_PROVIDER_SITE_OTHER): Payer: BC Managed Care – PPO | Admitting: Family Medicine

## 2019-12-31 VITALS — BP 100/68 | HR 70 | Temp 98.3°F | Resp 12 | Ht 65.0 in | Wt 137.2 lb

## 2019-12-31 DIAGNOSIS — Z0000 Encounter for general adult medical examination without abnormal findings: Secondary | ICD-10-CM | POA: Diagnosis not present

## 2019-12-31 DIAGNOSIS — Z79899 Other long term (current) drug therapy: Secondary | ICD-10-CM

## 2019-12-31 DIAGNOSIS — F988 Other specified behavioral and emotional disorders with onset usually occurring in childhood and adolescence: Secondary | ICD-10-CM | POA: Diagnosis not present

## 2019-12-31 DIAGNOSIS — Z23 Encounter for immunization: Secondary | ICD-10-CM | POA: Diagnosis not present

## 2019-12-31 MED ORDER — LISDEXAMFETAMINE DIMESYLATE 30 MG PO CAPS: 30.0000 mg | ORAL_CAPSULE | Freq: Every day | ORAL | 0 refills | Status: DC

## 2019-12-31 NOTE — Patient Instructions (Signed)
Preventive Care 40-39 Years Old, Female Preventive care refers to visits with your health care provider and lifestyle choices that can promote health and wellness. This includes:  A yearly physical exam. This may also be called an annual well check.  Regular dental visits and eye exams.  Immunizations.  Screening for certain conditions.  Healthy lifestyle choices, such as eating a healthy diet, getting regular exercise, not using drugs or products that contain nicotine and tobacco, and limiting alcohol use. What can I expect for my preventive care visit? Physical exam Your health care provider will check your:  Height and weight. This may be used to calculate body mass index (BMI), which tells if you are at a healthy weight.  Heart rate and blood pressure.  Skin for abnormal spots. Counseling Your health care provider may ask you questions about your:  Alcohol, tobacco, and drug use.  Emotional well-being.  Home and relationship well-being.  Sexual activity.  Eating habits.  Work and work environment.  Method of birth control.  Menstrual cycle.  Pregnancy history. What immunizations do I need?  Influenza (flu) vaccine  This is recommended every 40 year. Tetanus, diphtheria, and pertussis (Tdap) vaccine  You may need a Td booster every 40 years. Varicella (chickenpox) vaccine  You may need this if you have not been vaccinated. Human papillomavirus (HPV) vaccine  If recommended by your health care provider, you may need three doses over 6 months. Measles, mumps, and rubella (MMR) vaccine  You may need at least one dose of MMR. You may also need a second dose. Meningococcal conjugate (MenACWY) vaccine  One dose is recommended if you are age 40-21 years and a first-year college student living in a residence hall, or if you have one of several medical conditions. You may also need additional booster doses. Pneumococcal conjugate (PCV13) vaccine  You may need  this if you have certain conditions and were not previously vaccinated. Pneumococcal polysaccharide (PPSV23) vaccine  You may need one or two doses if you smoke cigarettes or if you have certain conditions. Hepatitis A vaccine  You may need this if you have certain conditions or if you travel or work in places where you may be exposed to hepatitis A. Hepatitis B vaccine  You may need this if you have certain conditions or if you travel or work in places where you may be exposed to hepatitis B. Haemophilus influenzae type b (Hib) vaccine  You may need this if you have certain conditions. You may receive vaccines as individual doses or as more than one vaccine together in one shot (combination vaccines). Talk with your health care provider about the risks and benefits of combination vaccines. What tests do I need?  Blood tests  Lipid and cholesterol levels. These may be checked every 5 years starting at age 40.  Hepatitis C test.  Hepatitis B test. Screening  Diabetes screening. This is done by checking your blood sugar (glucose) after you have not eaten for a while (fasting).  Sexually transmitted disease (STD) testing.  BRCA-related cancer screening. This may be done if you have a family history of breast, ovarian, tubal, or peritoneal cancers.  Pelvic exam and Pap test. This may be done every 3 years starting at age 40. Starting at age 40, this may be done every 5 years if you have a Pap test in combination with an HPV test. Talk with your health care provider about your test results, treatment options, and if necessary, the need for more tests.   Follow these instructions at home: Eating and drinking   Eat a diet that includes fresh fruits and vegetables, whole grains, lean protein, and low-fat dairy.  Take vitamin and mineral supplements as recommended by your health care provider.  Do not drink alcohol if: ? Your health care provider tells you not to drink. ? You are  pregnant, may be pregnant, or are planning to become pregnant.  If you drink alcohol: ? Limit how much you have to 0-1 drink a day. ? Be aware of how much alcohol is in your drink. In the [place] one drink equals one 12 oz bottle of beer (355 mL), one 5 oz glass of wine (148 mL), or one 1 oz glass of hard liquor (44 mL). Lifestyle  Take daily care of your teeth and gums.  Stay active. Exercise for at least 30 minutes on 5 or more days each week.  Do not use any products that contain nicotine or tobacco, such as cigarettes, e-cigarettes, and chewing tobacco. If you need help quitting, ask your health care provider.  If you are sexually active, practice safe sex. Use a condom or other form of birth control (contraception) in order to prevent pregnancy and STIs (sexually transmitted infections). If you plan to become pregnant, see your health care provider for a preconception visit. What's next?  Visit your health care provider once a year for a well check visit.  Ask your health care provider how often you should have your eyes and teeth checked.  Stay up to date on all vaccines. This information is not intended to replace advice given to you by your health care provider. Make sure you discuss any questions you have with your health care provider. Document Revised: 10/27/2017 Document Reviewed: 10/27/2017 Elsevier Patient Education  2020 [employer].

## 2019-12-31 NOTE — Progress Notes (Signed)
Subjective:     Christina Villarreal is a 39 y.o. female and is here for a comprehensive physical exam. The patient reports no problems.  Social History   Socioeconomic History  . Marital status: Married    Spouse name: Not on file  . Number of children: Not on file  . Years of education: Not on file  . 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 Smoking status: Never Smoker  . Smokeless tobacco: Never Used  Substance and Sexual Activity  . Alcohol use: Yes    Alcohol/week: 3.0 standard drinks    Types: 3 Glasses of wine per week  . Drug use: No  . Sexual activity: Yes    Partners: Male  Other Topics Concern  . Not on file  Social History Narrative   Married with 3 children    Social Determinants of Health   Financial Resource Strain:   . Difficulty of Paying Living Expenses: Not on file  Food Insecurity:   . Worried About Programme researcher, broadcasting/film/video in the Last Year: Not on file  . Ran Out of Food in the Last Year: Not on file  Transportation Needs:   . Lack of Transportation (Medical): Not on file  . Lack of Transportation (Non-Medical): Not on file  Physical Activity:   . Days of Exercise per Week: Not on file  . Minutes of Exercise per Session: Not on file  Stress:   . Feeling of Stress : Not on file  Social Connections:   . Frequency of Communication with Friends and Family: Not on file  . Frequency of Social Gatherings with Friends and Family: Not on file  . Attends Religious Services: Not on file  . Active Member of Clubs or Organizations: Not on file  . Attends Banker Meetings: Not on file  . Marital Status: Not on file  Intimate Partner Violence:   . Fear of Current or Ex-Partner: Not on file  . Emotionally Abused: Not on file  . Physically Abused: Not on file  . Sexually Abused: Not on file   Health Maintenance  Topic Date Due  . Hepatitis C Screening  Never done  . HIV Screening  Never done  .  PAP SMEAR-Modifier  11/27/2019  . TETANUS/TDAP  07/15/2027  . INFLUENZA VACCINE  Completed  . COVID-19 Vaccine  Completed    The following portions of the patient's history were reviewed and updated as appropriate:  She  has a past medical history of ADD (attention deficit disorder) and GERD (gastroesophageal reflux disease). She does not have any pertinent problems on file. She  has no past surgical history on file. Her family history includes Alzheimer's disease in her maternal grandmother; Breast cancer in her paternal grandmother; COPD in her paternal grandfather; Crohn's disease in her mother; Heart attack in her maternal grandfather; Hyperlipidemia in her mother; Lung cancer in her paternal grandfather. She  reports that she has never smoked. She has never used smokeless tobacco. She reports current alcohol use of about 3.0 standard drinks of alcohol per week. She reports that she does not use drugs. She has a current medication list which includes the following prescription(s): valacyclovir and lisdexamfetamine. Current Outpatient Medications on File Prior to Visit  Medication Sig Dispense Refill  . valACYclovir (VALTREX) 500 MG tablet Take 500 mg by mouth daily.      No current facility-administered medications on file prior to visit.  She has No Known Allergies..  Review of Systems Review of Systems  Constitutional: Negative for activity change, appetite change and fatigue.  HENT: Negative for hearing loss, congestion, tinnitus and ear discharge.  dentist q41m Eyes: Negative for visual disturbance (see optho q1y -- vision corrected to 20/20 with glasses).  Respiratory: Negative for cough, chest tightness and shortness of breath.   Cardiovascular: Negative for chest pain, palpitations and leg swelling.  Gastrointestinal: Negative for abdominal pain, diarrhea, constipation and abdominal distention.  Genitourinary: Negative for urgency, frequency, decreased urine volume and  difficulty urinating.  Musculoskeletal: Negative for back pain, arthralgias and gait problem.  Skin: Negative for color change, pallor and rash.  Neurological: Negative for dizziness, light-headedness, numbness and headaches.  Hematological: Negative for adenopathy. Does not bruise/bleed easily.  Psychiatric/Behavioral: Negative for suicidal ideas, confusion, sleep disturbance, self-injury, dysphoric mood, decreased concentration and agitation.       Objective:    BP 100/68 (BP Location: Right Arm, Cuff Size: Normal)   Pulse 70   Temp 98.3 F (36.8 C) (Oral)   Resp 12   Ht 5\' 5"  (1.651 m)   Wt 137 lb 3.2 oz (62.2 kg)   LMP 12/17/2019   SpO2 99%   BMI 22.83 kg/m  General appearance: alert, cooperative, appears stated age and no distress Head: Normocephalic, without obvious abnormality, atraumatic Eyes: negative findings: lids and lashes normal, conjunctivae and sclerae normal and pupils equal, round, reactive to light and accomodation Ears: normal TM's and external ear canals both ears Neck: no adenopathy, no carotid bruit, no JVD, supple, symmetrical, trachea midline and thyroid not enlarged, symmetric, no tenderness/mass/nodules Back: symmetric, no curvature. ROM normal. No CVA tenderness. Lungs: clear to auscultation bilaterally Breasts: gyn Heart: regular rate and rhythm, S1, S2 normal, no murmur, click, rub or gallop Abdomen: soft, non-tender; bowel sounds normal; no masses,  no organomegaly Pelvic: deferred--gyn Extremities: extremities normal, atraumatic, no cyanosis or edema Pulses: 2+ and symmetric Skin: Skin color, texture, turgor normal. No rashes or lesions Lymph nodes: Cervical, supraclavicular, and axillary nodes normal. Neurologic: Alert and oriented X 3, normal strength and tone. Normal symmetric reflexes. Normal coordination and gait    Assessment:    Healthy female exam.      Plan:    ghm utd Check labs  See After Visit Summary for Counseling  Recommendations    1. Attention deficit disorder (ADD) in adult Increase vyvanse to 30 mg and f/u 1 month - lisdexamfetamine (VYVANSE) 30 MG capsule; Take 1 capsule (30 mg total) by mouth daily.  Dispense: 30 capsule; Refill: 0 - DRUG MONITORING, PANEL 8 WITH CONFIRMATION, URINE  2. Preventative health care ghm utd Check labs  - Lipid panel - CBC with Differential/Platelet - TSH - Comprehensive metabolic panel  3. Need for influenza vaccination  - Flu Vaccine QUAD 36+ mos IM  4. High risk medication use  - DRUG MONITORING, PANEL 8 WITH CONFIRMATION, URINE

## 2020-01-01 LAB — CBC WITH DIFFERENTIAL/PLATELET
Absolute Monocytes: 454 cells/uL (ref 200–950)
Basophils Absolute: 50 cells/uL (ref 0–200)
Basophils Relative: 0.9 %
Eosinophils Absolute: 73 cells/uL (ref 15–500)
Eosinophils Relative: 1.3 %
HCT: 37.4 % (ref 35.0–45.0)
Hemoglobin: 12.5 g/dL (ref 11.7–15.5)
Lymphs Abs: 2111 cells/uL (ref 850–3900)
MCH: 29.6 pg (ref 27.0–33.0)
MCHC: 33.4 g/dL (ref 32.0–36.0)
MCV: 88.6 fL (ref 80.0–100.0)
MPV: 10.5 fL (ref 7.5–12.5)
Monocytes Relative: 8.1 %
Neutro Abs: 2912 cells/uL (ref 1500–7800)
Neutrophils Relative %: 52 %
Platelets: 348 10*3/uL (ref 140–400)
RBC: 4.22 10*6/uL (ref 3.80–5.10)
RDW: 12.9 % (ref 11.0–15.0)
Total Lymphocyte: 37.7 %
WBC: 5.6 10*3/uL (ref 3.8–10.8)

## 2020-01-01 LAB — LIPID PANEL
Cholesterol: 178 mg/dL (ref <200)
HDL: 71 mg/dL (ref >=50)
LDL Cholesterol (Calc): 92 mg/dL (calc)
Non-HDL Cholesterol (Calc): 107 mg/dL (calc) (ref <130)
Total CHOL/HDL Ratio: 2.5 (calc) (ref <5.0)
Triglycerides: 60 mg/dL (ref <150)

## 2020-01-01 LAB — COMPREHENSIVE METABOLIC PANEL
AG Ratio: 1.4 (calc) (ref 1.0–2.5)
ALT: 12 U/L (ref 6–29)
AST: 16 U/L (ref 10–30)
Albumin: 4.3 g/dL (ref 3.6–5.1)
Alkaline phosphatase (APISO): 39 U/L (ref 31–125)
BUN: 17 mg/dL (ref 7–25)
CO2: 26 mmol/L (ref 20–32)
Calcium: 9.3 mg/dL (ref 8.6–10.2)
Chloride: 103 mmol/L (ref 98–110)
Creat: 0.71 mg/dL (ref 0.50–1.10)
Globulin: 3 g/dL (calc) (ref 1.9–3.7)
Glucose, Bld: 83 mg/dL (ref 65–99)
Potassium: 4.4 mmol/L (ref 3.5–5.3)
Sodium: 136 mmol/L (ref 135–146)
Total Bilirubin: 0.5 mg/dL (ref 0.2–1.2)
Total Protein: 7.3 g/dL (ref 6.1–8.1)

## 2020-01-01 LAB — TSH: TSH: 2 mIU/L

## 2020-01-02 LAB — DRUG MONITORING, PANEL 8 WITH CONFIRMATION, URINE
6 Acetylmorphine: NEGATIVE ng/mL (ref <10)
Alcohol Metabolites: NEGATIVE ng/mL
Amphetamine: 1283 ng/mL — ABNORMAL HIGH (ref <250)
Amphetamines: POSITIVE ng/mL — AB (ref <500)
Benzodiazepines: NEGATIVE ng/mL (ref <100)
Buprenorphine, Urine: NEGATIVE ng/mL (ref <5)
Cocaine Metabolite: NEGATIVE ng/mL (ref <150)
Creatinine: 99.1 mg/dL
MDMA: NEGATIVE ng/mL (ref <500)
Marijuana Metabolite: NEGATIVE ng/mL (ref <20)
Methamphetamine: NEGATIVE ng/mL (ref <250)
Opiates: NEGATIVE ng/mL (ref <100)
Oxidant: NEGATIVE ug/mL
Oxycodone: NEGATIVE ng/mL (ref <100)
pH: 6.2 (ref 4.5–9.0)

## 2020-01-02 LAB — DM TEMPLATE

## 2020-01-03 ENCOUNTER — Encounter: Payer: Self-pay | Admitting: Family Medicine

## 2020-01-03 NOTE — Telephone Encounter (Signed)
Its possible but it should have been out of her system by then--- its an 8 hour med She should not take it for a few days and see if it happens again

## 2020-01-08 ENCOUNTER — Emergency Department (HOSPITAL_BASED_OUTPATIENT_CLINIC_OR_DEPARTMENT_OTHER)
Admission: EM | Admit: 2020-01-08 | Discharge: 2020-01-08 | Disposition: A | Discharge status: Home / Self Care | Payer: BC Managed Care – PPO | Attending: Emergency Medicine | Admitting: Emergency Medicine

## 2020-01-08 ENCOUNTER — Emergency Department (HOSPITAL_BASED_OUTPATIENT_CLINIC_OR_DEPARTMENT_OTHER): Payer: BC Managed Care – PPO

## 2020-01-08 ENCOUNTER — Other Ambulatory Visit: Payer: Self-pay

## 2020-01-08 ENCOUNTER — Encounter (HOSPITAL_BASED_OUTPATIENT_CLINIC_OR_DEPARTMENT_OTHER): Payer: Self-pay | Admitting: *Deleted

## 2020-01-08 DIAGNOSIS — R42 Dizziness and giddiness: Secondary | ICD-10-CM | POA: Diagnosis not present

## 2020-01-08 DIAGNOSIS — I951 Orthostatic hypotension: Secondary | ICD-10-CM | POA: Diagnosis not present

## 2020-01-08 DIAGNOSIS — I10 Essential (primary) hypertension: Secondary | ICD-10-CM | POA: Diagnosis not present

## 2020-01-08 DIAGNOSIS — R55 Syncope and collapse: Secondary | ICD-10-CM

## 2020-01-08 DIAGNOSIS — R519 Headache, unspecified: Secondary | ICD-10-CM | POA: Diagnosis not present

## 2020-01-08 LAB — PREGNANCY, URINE: Preg Test, Ur: NEGATIVE

## 2020-01-08 LAB — CBC
HCT: 40.5 % (ref 36.0–46.0)
Hemoglobin: 13.4 g/dL (ref 12.0–15.0)
MCH: 28.9 pg (ref 26.0–34.0)
MCHC: 33.1 g/dL (ref 30.0–36.0)
MCV: 87.3 fL (ref 80.0–100.0)
Platelets: 385 10*3/uL (ref 150–400)
RBC: 4.64 MIL/uL (ref 3.87–5.11)
RDW: 13.5 % (ref 11.5–15.5)
WBC: 10.5 10*3/uL (ref 4.0–10.5)
nRBC: 0 % (ref 0.0–0.2)

## 2020-01-08 LAB — BASIC METABOLIC PANEL
Anion gap: 10 (ref 5–15)
BUN: 17 mg/dL (ref 6–20)
CO2: 23 mmol/L (ref 22–32)
Calcium: 9.4 mg/dL (ref 8.9–10.3)
Chloride: 103 mmol/L (ref 98–111)
Creatinine, Ser: 0.84 mg/dL (ref 0.44–1.00)
GFR, Estimated: >60 mL/min (ref >60)
Glucose, Bld: 92 mg/dL (ref 70–99)
Potassium: 3.6 mmol/L (ref 3.5–5.1)
Sodium: 136 mmol/L (ref 135–145)

## 2020-01-08 LAB — URINALYSIS, ROUTINE W REFLEX MICROSCOPIC
Bilirubin Urine: NEGATIVE
Glucose, UA: NEGATIVE mg/dL
Ketones, ur: NEGATIVE mg/dL
Nitrite: NEGATIVE
Protein, ur: NEGATIVE mg/dL
Specific Gravity, Urine: 1.01 (ref 1.005–1.030)
pH: 6 (ref 5.0–8.0)

## 2020-01-08 LAB — URINALYSIS, MICROSCOPIC (REFLEX)

## 2020-01-08 NOTE — ED Triage Notes (Addendum)
Pt reports syncopal episode last week while having a BM. Reports another lightheaded episode today while driving. Reports that she had covid in September, also started on vyvanse recently for ADD. States that she has been checking her BP at home and reports HTN (175/110 PTA).    Pt reports feeling faint at this time, denies pain.

## 2020-01-08 NOTE — ED Provider Notes (Signed)
[facility] EMERGENCY DEPARTMENT Provider Note   CSN: [id number] Arrival date & time: 01/08/20  1608     History Chief Complaint  Patient presents with  . Hypertension    Christina Villarreal is a 39 y.o. female.  The history is provided by the patient and medical records.  Hypertension   Christina Villarreal is a 39 y.o. female who presents to the Emergency Department complaining of dizziness.  Six days ago she went to get up in the night to use the bathroom and got dizzy, fainted and fell, striking her head.   Two weeks ago she started Vyvanse for ADHD.  She stopped taking the vyvanse after her syncopal event.    Today she felt lightheaded after taking a shower.  This evening she felt lightheaded again.  She checked her BP and found it was high at 175 systolic.    Had COVID 19 (mild) Sept 23.  Has been fully vaccinated for covid 19.    Had headache in October - now gone for the last week.  She attributed the HA to possible COVID.    Denies chest pain, sob, AP, N/V/D, leg swelling/pain.      Past Medical History:  Diagnosis Date  . ADD (attention deficit disorder)   . GERD (gastroesophageal reflux disease)     Patient Active Problem List   Diagnosis Date Noted  . Postpartum depression 12/20/2017  . Acute pharyngitis 07/30/2016  . General medical examination 04/20/2011  . Sinusitis 03/22/2011  . GERD 12/29/2007  . ADD 07/15/2006    History reviewed. No pertinent surgical history.   OB History   No obstetric history on file.     Family History  Problem Relation Age of Onset  . Crohn's disease Mother   . Hyperlipidemia Mother   . Alzheimer's disease Maternal Grandmother   . Heart attack Maternal Grandfather   . Breast cancer Paternal Grandmother   . Lung cancer Paternal Grandfather        smoker  . COPD Paternal Grandfather     Social History   Tobacco Use  . Smoking status: Never Smoker  . Smokeless tobacco: Never Used  Substance Use Topics  . Alcohol  use: Yes    Alcohol/week: 3.0 standard drinks    Types: 3 Glasses of wine per week  . Drug use: No    Home Medications Prior to Admission medications   Medication Sig Start Date End Date Taking? Authorizing Provider  lisdexamfetamine (VYVANSE) 30 MG capsule Take 1 capsule (30 mg total) by mouth daily. 12/31/19   [name], DO  valACYclovir (VALTREX) 500 MG tablet Take 500 mg by mouth daily.     [provider]    Allergies    Patient has no known allergies.  Review of Systems   Review of Systems  All other systems reviewed and are negative.   Physical Exam Updated Vital Signs BP 124/84 (BP Location: Right Arm)   Pulse 81   Temp 98.5 F (36.9 C) (Oral)   Resp 17   Ht 5\' 4"  (1.626 m)   Wt 62.1 kg   LMP 12/17/2019   SpO2 100%   BMI 23.52 kg/m   Physical Exam Vitals and nursing note reviewed.  Constitutional:      Appearance: She is well-developed.  HENT:     Head: Normocephalic and atraumatic.  Cardiovascular:     Rate and Rhythm: Normal rate and regular rhythm.     Heart sounds: No murmur heard.  Pulmonary:     Effort: Pulmonary effort is normal. No respiratory distress.     Breath sounds: Normal breath sounds.  Abdominal:     Palpations: Abdomen is soft.     Tenderness: There is no abdominal tenderness. There is no guarding or rebound.  Musculoskeletal:        General: No swelling or tenderness.  Skin:    General: Skin is warm and dry.  Neurological:     General: No focal deficit present.     Mental Status: She is alert and oriented to person, place, and time.     Comments: PERRL, EOMI, no asymmetry of facial movements, no ataxia on FTN bilaterally.  No pronator drift. 5/5 strength in all four extremities with sensation to light touch intact in all four extremities.    Psychiatric:        Behavior: Behavior normal.     ED Results / Procedures / Treatments   Labs (all labs ordered are listed, but only abnormal results are  displayed) Labs Reviewed  URINALYSIS, ROUTINE W REFLEX MICROSCOPIC - Abnormal; Notable for the following components:      Result Value   Hgb urine dipstick TRACE (*)    Leukocytes,Ua TRACE (*)    All other components within normal limits  URINALYSIS, MICROSCOPIC (REFLEX) - Abnormal; Notable for the following components:   Bacteria, UA FEW (*)    All other components within normal limits  BASIC METABOLIC PANEL  CBC  PREGNANCY, URINE  CBG MONITORING, ED    EKG EKG Interpretation  Date/Time:  Tuesday January 08 2020 16:25:38 EST Ventricular Rate:  85 PR Interval:  124 QRS Duration: 86 QT Interval:  380 QTC Calculation: 452 R Axis:   86 Text Interpretation: Normal sinus rhythm Normal ECG Confirmed by Tilden Fossa [phone]) on 01/08/2020 9:41:18 PM   Radiology CT Head Wo Contrast  Result Date: 01/08/2020 CLINICAL DATA:  Recurrent syncope, head injury 1 week ago, headaches EXAM: CT HEAD WITHOUT CONTRAST TECHNIQUE: Contiguous axial images were obtained from the base of the skull through the vertex without intravenous contrast. COMPARISON:  None. FINDINGS: Brain: No acute infarct or hemorrhage. Lateral ventricles and midline structures are unremarkable. No acute extra-axial fluid collections. No mass effect. Vascular: No hyperdense vessel or unexpected calcification. Skull: Normal. Negative for fracture or focal lesion. Sinuses/Orbits: No acute finding. Other: None. IMPRESSION: 1. No acute intracranial process. Electronically Signed   By: Sharlet Salina M.D.   On: 01/08/2020 22:13    Procedures Procedures (including critical care time)  Medications Ordered in ED Medications - No data to display  ED Course  I have reviewed the triage vital signs and the nursing notes.  Pertinent labs & imaging results that were available during my care of the patient were reviewed by me and considered in my medical decision making (see chart for details).    MDM Rules/Calculators/A&P                          patient here for evaluation of recurrent near syncopal episodes with dizziness. She did have one a syncopal episode one week ago. She is non-toxic appearing on evaluation with no focal neurologic deficits. Labs with no significant electrolyte abnormality or anemia. Given her recent head injury, reports of recent headaches a CT head was obtained, which is negative for acute CVA, acute intracranial bleed or abnormality. She reports hypertension at home and was mildly hypertensive on ED arrival, this improved without any  intervention. She is noted to be mildly orthostatic during her ED stay but does not appear to be symptomatic from it at this time. Presentation is not consistent with PE, significant arrhythmia. Discussed with patient home care for near syncope. Discussed outpatient follow-up.  Final Clinical Impression(s) / ED Diagnoses Final diagnoses:  Near syncope  Orthostasis    Rx / DC Orders ED Discharge Orders    None       Tilden Fossa, MD 01/08/20 2328

## 2020-02-06 DIAGNOSIS — L814 Other melanin hyperpigmentation: Secondary | ICD-10-CM | POA: Diagnosis not present

## 2020-02-06 DIAGNOSIS — D225 Melanocytic nevi of trunk: Secondary | ICD-10-CM | POA: Diagnosis not present

## 2020-02-06 DIAGNOSIS — D229 Melanocytic nevi, unspecified: Secondary | ICD-10-CM | POA: Diagnosis not present

## 2020-02-06 DIAGNOSIS — L2089 Other atopic dermatitis: Secondary | ICD-10-CM | POA: Diagnosis not present

## 2020-02-06 DIAGNOSIS — D485 Neoplasm of uncertain behavior of skin: Secondary | ICD-10-CM | POA: Diagnosis not present

## 2020-02-08 ENCOUNTER — Other Ambulatory Visit: Payer: Self-pay | Admitting: Family Medicine

## 2020-02-08 DIAGNOSIS — Z1151 Encounter for screening for human papillomavirus (HPV): Secondary | ICD-10-CM | POA: Diagnosis not present

## 2020-02-08 DIAGNOSIS — F988 Other specified behavioral and emotional disorders with onset usually occurring in childhood and adolescence: Secondary | ICD-10-CM

## 2020-02-08 DIAGNOSIS — Z01419 Encounter for gynecological examination (general) (routine) without abnormal findings: Secondary | ICD-10-CM | POA: Diagnosis not present

## 2020-02-08 DIAGNOSIS — Z124 Encounter for screening for malignant neoplasm of cervix: Secondary | ICD-10-CM | POA: Diagnosis not present

## 2020-02-11 MED ORDER — LISDEXAMFETAMINE DIMESYLATE 30 MG PO CAPS: 30.0000 mg | ORAL_CAPSULE | Freq: Every day | ORAL | 0 refills | Status: DC

## 2020-02-11 NOTE — Telephone Encounter (Signed)
Requesting: Vyvanse 30mg  Contract: 12/17/2019 UDS: 12/31/2019 Last Visit: 12/31/19 Next Visit: None Last Refill: 12/31/2019 #30 and 0RF  Please Advise

## 2020-03-11 ENCOUNTER — Other Ambulatory Visit: Payer: Self-pay | Admitting: Family Medicine

## 2020-03-11 DIAGNOSIS — F988 Other specified behavioral and emotional disorders with onset usually occurring in childhood and adolescence: Secondary | ICD-10-CM

## 2020-03-11 MED ORDER — LISDEXAMFETAMINE DIMESYLATE 30 MG PO CAPS: 30.0000 mg | ORAL_CAPSULE | Freq: Every day | ORAL | 0 refills | Status: DC

## 2020-03-11 NOTE — Telephone Encounter (Signed)
Requesting: Vyvanse 30mg  Contract: 12/17/2019 UDS: 12/31/2019 Last Visit: 12/31/2019  Next Visit: None Last Refill: 02/11/2020 #30 and 0RF  Please Advise

## 2020-04-10 DIAGNOSIS — Z1231 Encounter for screening mammogram for malignant neoplasm of breast: Secondary | ICD-10-CM | POA: Diagnosis not present

## 2020-04-15 ENCOUNTER — Other Ambulatory Visit: Payer: Self-pay | Admitting: Family Medicine

## 2020-04-15 DIAGNOSIS — F988 Other specified behavioral and emotional disorders with onset usually occurring in childhood and adolescence: Secondary | ICD-10-CM

## 2020-04-15 MED ORDER — LISDEXAMFETAMINE DIMESYLATE 30 MG PO CAPS: 30.0000 mg | ORAL_CAPSULE | Freq: Every day | ORAL | 0 refills | Status: DC

## 2020-04-15 NOTE — Telephone Encounter (Signed)
Requesting: Vyvanse   Contract: none recent  UDS: 12/31/19   Last Visit: 12/31/2019 Next Visit: none Last Refill: 03/11/2020  Please Advise

## 2020-05-12 ENCOUNTER — Other Ambulatory Visit: Payer: Self-pay | Admitting: Family Medicine

## 2020-05-12 DIAGNOSIS — F988 Other specified behavioral and emotional disorders with onset usually occurring in childhood and adolescence: Secondary | ICD-10-CM

## 2020-05-12 MED ORDER — LISDEXAMFETAMINE DIMESYLATE 30 MG PO CAPS: 30.0000 mg | ORAL_CAPSULE | Freq: Every day | ORAL | 0 refills | Status: DC

## 2020-05-12 NOTE — Telephone Encounter (Signed)
Requesting: Vyvanse Contract:  need UDS: 12/31/19 Last Visit: 12/31/19 Next Visit: none Last Refill: 04/15/20   Please Advise

## 2020-06-15 ENCOUNTER — Other Ambulatory Visit: Payer: Self-pay | Admitting: Family Medicine

## 2020-06-15 DIAGNOSIS — F988 Other specified behavioral and emotional disorders with onset usually occurring in childhood and adolescence: Secondary | ICD-10-CM

## 2020-06-16 DIAGNOSIS — L570 Actinic keratosis: Secondary | ICD-10-CM | POA: Diagnosis not present

## 2020-06-16 MED ORDER — LISDEXAMFETAMINE DIMESYLATE 30 MG PO CAPS: 30.0000 mg | ORAL_CAPSULE | Freq: Every day | ORAL | 0 refills | Status: DC

## 2020-06-16 NOTE — Telephone Encounter (Signed)
Requesting: Vyvnase 30mg  Contract: 12/17/2019 UDS: 12/31/2019 Last Visit: 12/31/2019 Next Visit: None Last Refill: 05/12/2020 #30 and 0RF  Please Advise

## 2020-07-22 ENCOUNTER — Other Ambulatory Visit: Payer: Self-pay | Admitting: Family Medicine

## 2020-07-22 DIAGNOSIS — F988 Other specified behavioral and emotional disorders with onset usually occurring in childhood and adolescence: Secondary | ICD-10-CM

## 2020-07-22 MED ORDER — LISDEXAMFETAMINE DIMESYLATE 30 MG PO CAPS: 30.0000 mg | ORAL_CAPSULE | Freq: Every day | ORAL | 0 refills | Status: DC

## 2020-07-22 NOTE — Telephone Encounter (Signed)
Requesting: Vyvanse Contract: 12/31/2019 UDS: 12/31/2019 Last OV: 12/31/2019 Next OV: N/A Last Refill: 06/16/2020, #30-0 RF Database:   Please advise

## 2020-08-19 ENCOUNTER — Other Ambulatory Visit: Payer: Self-pay | Admitting: Family Medicine

## 2020-08-19 DIAGNOSIS — F988 Other specified behavioral and emotional disorders with onset usually occurring in childhood and adolescence: Secondary | ICD-10-CM

## 2020-08-19 NOTE — Telephone Encounter (Signed)
Requesting: Vyvanse 30mg   Contract: 12/17/2019  UDS: 12/31/2019 Last Visit: 12/31/2019 Next Visit: None Last Refill: 07/22/2020 #30 and 0RF  Please Advise

## 2020-08-20 MED ORDER — LISDEXAMFETAMINE DIMESYLATE 30 MG PO CAPS: 30.0000 mg | ORAL_CAPSULE | Freq: Every day | ORAL | 0 refills | Status: DC

## 2020-09-18 ENCOUNTER — Other Ambulatory Visit: Payer: Self-pay | Admitting: Family Medicine

## 2020-09-18 DIAGNOSIS — F988 Other specified behavioral and emotional disorders with onset usually occurring in childhood and adolescence: Secondary | ICD-10-CM

## 2020-09-18 MED ORDER — LISDEXAMFETAMINE DIMESYLATE 30 MG PO CAPS: 30.0000 mg | ORAL_CAPSULE | Freq: Every day | ORAL | 0 refills | Status: DC

## 2020-09-18 NOTE — Telephone Encounter (Signed)
Requesting: Vyvanse 30mg  Contract: 12/17/2019 UDS: 12/31/2019 Last Visit: 12/31/2019 Next Visit: None Last Refill: 08/20/2020 #30 and 0RF  Please Advise

## 2020-10-22 ENCOUNTER — Other Ambulatory Visit: Payer: Self-pay | Admitting: Family Medicine

## 2020-10-22 DIAGNOSIS — F988 Other specified behavioral and emotional disorders with onset usually occurring in childhood and adolescence: Secondary | ICD-10-CM

## 2020-10-22 NOTE — Telephone Encounter (Signed)
Requesting: Vyvanse 30mg  Contract: 12/17/2019 UDS: 12/31/2019 Last Visit: 12/31/2019 Next Visit: None Last Refill: 09/18/2020 #30 and 0RF  Please Advise

## 2020-10-26 ENCOUNTER — Other Ambulatory Visit: Payer: Self-pay | Admitting: Family Medicine

## 2020-10-26 DIAGNOSIS — F988 Other specified behavioral and emotional disorders with onset usually occurring in childhood and adolescence: Secondary | ICD-10-CM

## 2020-10-27 MED ORDER — LISDEXAMFETAMINE DIMESYLATE 30 MG PO CAPS: 30.0000 mg | ORAL_CAPSULE | Freq: Every day | ORAL | 0 refills | Status: DC

## 2020-10-27 NOTE — Telephone Encounter (Signed)
Requesting: vyvanse Contract:  UDS: 12/31/19 Last Visit: 12/31/19 Next Visit: none Last Refill: 09/18/20  Please Advise

## 2020-10-27 NOTE — Telephone Encounter (Signed)
Patient has made a follow up appt for 11/06/20

## 2020-11-06 ENCOUNTER — Ambulatory Visit: Payer: BC Managed Care – PPO | Admitting: Family Medicine

## 2020-11-14 ENCOUNTER — Encounter: Payer: Self-pay | Admitting: Family Medicine

## 2020-11-14 ENCOUNTER — Other Ambulatory Visit: Payer: Self-pay

## 2020-11-14 ENCOUNTER — Ambulatory Visit: Payer: BC Managed Care – PPO | Admitting: Family Medicine

## 2020-11-14 VITALS — BP 120/80 | HR 89 | Temp 98.2°F | Resp 18 | Ht 64.0 in | Wt 130.2 lb

## 2020-11-14 DIAGNOSIS — F988 Other specified behavioral and emotional disorders with onset usually occurring in childhood and adolescence: Secondary | ICD-10-CM

## 2020-11-14 DIAGNOSIS — F3281 Premenstrual dysphoric disorder: Secondary | ICD-10-CM

## 2020-11-14 MED ORDER — LISDEXAMFETAMINE DIMESYLATE 30 MG PO CAPS: 30.0000 mg | ORAL_CAPSULE | Freq: Every day | ORAL | 0 refills | Status: DC

## 2020-11-14 MED ORDER — ESCITALOPRAM OXALATE 10 MG PO TABS: 10.0000 mg | ORAL_TABLET | Freq: Every day | ORAL | 3 refills | Status: DC

## 2020-11-14 NOTE — Patient Instructions (Signed)
Premenstrual Syndrome Premenstrual syndrome (PMS) is a group of physical, emotional, and behavioral symptoms that affect women as part of their menstrual cycle. PMS occurs 1-2 weeks before the start of a woman's menstrual period and goes away a few daysafter menstrual bleeding begins. PMS can range from mild to severe. What are the causes? The exact cause of this condition is not known, but it seems to be related tohormone changes that happen before menstruation. What are the signs or symptoms? Symptoms of this condition often happen every month. They go away after your period starts. Physical symptoms of this condition include: Bloating. Breast pain or tenderness. Headaches. Extreme fatigue. Backaches. Swelling of the hands and feet. Weight gain. Hot flashes. Emotional symptoms of this condition include: Mood swings. Depression. Angry or hostile outbursts. Irritability. Anxiety. Crying spells. Behavioral symptoms include: Food cravings or appetite changes. Changes in sexual desire. Confusion. Social withdrawal. Poor concentration. How is this diagnosed? This condition may be diagnosed based on a history of your symptoms. This condition is generally diagnosed if symptoms of PMS: Are present in the 5 days before your period starts. End within 4 days after your period starts. Happen at least 3 months in a row. Interfere with some of your normal activities. Other conditions that can cause some of these symptoms must be ruled out before PMS can be diagnosed. These include depression, anxiety, anemia, and thyroidproblems. How is this treated? This condition may be treated by doing the following: Maintaining a healthy lifestyle. This includes eating a well-balanced diet and exercising regularly. Taking over-the-counter medicines that can help relieve symptoms, such as cramps, aches, pain, headaches, and breast tenderness. Follow these instructions at home: Eating and drinking  Eat  a well-balanced diet. Avoid caffeine and alcohol. Limit the amount of salt and salty foods you eat. This will help reduce bloating. Drink enough fluid to keep your urine pale yellow. Take a multivitamin if told to do so by your health care provider.  Lifestyle  Do not use any products that contain nicotine or tobacco. These products include cigarettes, chewing tobacco, and vaping devices, such as e-cigarettes. If you need help quitting, ask your health care provider. Exercise regularly as suggested by your health care provider. Get enough sleep. For most adults, this is 7-8 hours of sleep each night. Practice relaxation techniques, such as yoga, tai chi, or meditation. Find healthy ways to manage stress.  General instructions  For 2-3 months, write down your symptoms, whether they are mild to severe, and how long they last. This will help your health care provider choose the best treatment for you. Take over-the-counter and prescription medicines only as told by your health care provider. If you are using birth control pills (oral contraceptives), use them as told by your health care provider.  Contact a health care provider if: Your symptoms get worse. You develop new symptoms. You have trouble doing your daily activities. Summary Premenstrual syndrome (PMS) is a group of physical, emotional, and behavioral symptoms that affect women as part of their menstrual cycle. PMS starts 1-2 weeks before the start of a woman's period and goes away a few days after the period starts. PMS is treated by maintaining a healthy lifestyle and taking medicines to relieve the symptoms. This information is not intended to replace advice given to you by your health care provider. Make sure you discuss any questions you have with your healthcare provider. Document Revised: 10/05/2019 Document Reviewed: 10/05/2019 Elsevier Patient Education  2022 Elsevier Inc.  

## 2020-11-14 NOTE — Assessment & Plan Note (Signed)
lexapro 10 mg qd  F/u 6 months or sooner prn

## 2020-11-14 NOTE — Assessment & Plan Note (Signed)
Stable con't meds  Pt with no problems with meds

## 2020-11-14 NOTE — Progress Notes (Signed)
Subjective:   By signing my name below, I, [name], attest that this documentation has been prepared under the direction and in the presence of  [name], DO. 11/14/2020    Patient ID: Christina Villarreal, female    DOB: 1980-10-31, 40 y.o.   MRN: [id number]  Chief Complaint  Patient presents with   ADD   Follow-up    HPI Patient is in today for an office visit.  She mentions she has been doing well and had a good summer.  She reports doing well with 30 mg Vyvanse and is requesting a refill.  She has an appointment with her OBGYN because she gets mood swings when she gets on her period. She has been diagnosed with PMDD. She was using 10 mg lexapro but had to stop because she got pregnant and was nursing. She is asking to be started back on it.   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  Past Medical History:  Diagnosis Date   ADD (attention deficit disorder)    GERD (gastroesophageal reflux disease)     No past surgical history on file.  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  Tobacco Use   Smoking status: Never   Smokeless tobacco: Never  Substance and Sexual Activity   Alcohol use: Yes    Alcohol/week: 3.0 standard drinks    Types: 3 Glasses of wine per week   Drug use: No   Sexual activity: Yes    Partners: Male  Other Topics Concern   Not on file  Social History Narrative   Married with 3  children    Social Determinants of Health   Financial Resource Strain: Not on file  Food Insecurity: Not on file  Transportation Needs: Not on file  Physical Activity: Not on file  Stress: Not on file  Social Connections: Not on file  Intimate Partner Violence: Not on file    Outpatient Medications Prior to Visit  Medication Sig Dispense Refill   valACYclovir (VALTREX) 500 MG tablet Take 500 mg by mouth daily.      lisdexamfetamine (VYVANSE) 30 MG capsule Take 1 capsule (30 mg total) by mouth daily. 30 capsule 0   No facility-administered medications prior to visit.    No Known Allergies  Review of Systems  Constitutional:  Negative for chills, fever and malaise/fatigue.  HENT:  Negative for congestion and hearing loss.   Eyes:  Negative for discharge.  Respiratory:  Negative for cough, sputum production and shortness of breath.   Cardiovascular:  Negative for chest pain, palpitations and leg swelling.  Gastrointestinal:  Negative for abdominal pain, blood in stool, constipation, diarrhea, heartburn, nausea and vomiting.  Genitourinary:  Negative for dysuria, frequency, hematuria and urgency.  Musculoskeletal:  Negative for back pain, falls and myalgias.  Skin:  Negative for rash.  Neurological:  Negative for dizziness, sensory change, loss of consciousness, weakness and headaches.  Endo/Heme/Allergies:  Negative for environmental allergies. Does not bruise/bleed easily.  Psychiatric/Behavioral:  Negative for depression and suicidal ideas. The patient is not nervous/anxious and does not have insomnia.       Objective:    Physical Exam Vitals and nursing note reviewed.  Constitutional:      General: She is not in acute distress.    Appearance: Normal appearance. She is not ill-appearing.  HENT:     Head: Normocephalic and atraumatic.     Right Ear: External ear normal.     Left Ear: External ear normal.  Eyes:     Extraocular Movements: Extraocular movements intact.      Pupils: Pupils are equal, round, and reactive to light.  Cardiovascular:     Rate and Rhythm: Normal rate and regular rhythm.     Pulses: Normal pulses.     Heart sounds: Normal heart sounds. No murmur heard.   No gallop.  Pulmonary:     Effort: Pulmonary effort is normal. No respiratory distress.     Breath sounds: Normal breath sounds. No wheezing, rhonchi or rales.  Abdominal:     General: Bowel sounds are normal. There is no distension.     Palpations: Abdomen is soft. There is no mass.     Tenderness: There is no abdominal tenderness. There is no guarding or rebound.     Hernia: No hernia is present.  Musculoskeletal:     Cervical back: Normal range of motion and neck supple.  Lymphadenopathy:     Cervical: No cervical adenopathy.  Skin:    General: Skin is warm and dry.  Neurological:     Mental Status: She is alert and oriented to person, place, and time.  Psychiatric:        Behavior: Behavior normal.    BP 120/80 (BP Location: Left Arm, Patient Position: Sitting, Cuff Size: Normal)   Pulse 89   Temp 98.2 F (36.8 C) (Oral)   Resp 18   Ht 5\' 4"  (1.626 m)   Wt 130 lb 3.2 oz (59.1 kg)   SpO2 99%   BMI 22.35 kg/m  Wt Readings from Last 3 Encounters:  11/14/20 130 lb 3.2 oz (59.1 kg)  01/08/20 137 lb (62.1 kg)  12/31/19 137 lb 3.2 oz (62.2 kg)    Diabetic Foot Exam - Simple   No data filed    Lab Results  Component Value Date   WBC 10.5 01/08/2020   HGB 13.4 01/08/2020   HCT 40.5 01/08/2020   PLT 385 01/08/2020   GLUCOSE 92 01/08/2020   CHOL 178 12/31/2019   TRIG 60 12/31/2019   HDL 71 12/31/2019   LDLCALC 92 12/31/2019   ALT 12 12/31/2019   AST 16 12/31/2019   NA 136 01/08/2020   K 3.6 01/08/2020   CL 103 01/08/2020   CREATININE 0.84 01/08/2020   BUN 17 01/08/2020   CO2 23 01/08/2020   TSH 2.00 12/31/2019    Lab Results  Component Value Date   TSH 2.00 12/31/2019   Lab Results  Component Value Date   WBC 10.5 01/08/2020   HGB 13.4  01/08/2020   HCT 40.5 01/08/2020   MCV 87.3 01/08/2020   PLT 385 01/08/2020   Lab Results  Component Value Date   NA 136 01/08/2020   K 3.6 01/08/2020   CO2 23 01/08/2020   GLUCOSE 92 01/08/2020  BUN 17 01/08/2020   CREATININE 0.84 01/08/2020   BILITOT 0.5 12/31/2019   ALKPHOS 39 04/26/2016   AST 16 12/31/2019   ALT 12 12/31/2019   PROT 7.3 12/31/2019   ALBUMIN 4.4 04/26/2016   CALCIUM 9.4 01/08/2020   ANIONGAP 10 01/08/2020   GFR 92.81 04/26/2016   Lab Results  Component Value Date   CHOL 178 12/31/2019   Lab Results  Component Value Date   HDL 71 12/31/2019   Lab Results  Component Value Date   LDLCALC 92 12/31/2019   Lab Results  Component Value Date   TRIG 60 12/31/2019   Lab Results  Component Value Date   CHOLHDL 2.5 12/31/2019   No results found for: HGBA1C     Assessment & Plan:   Problem List Items Addressed This Visit       Unprioritized   Attention deficit disorder    Stable con't meds  Pt with no problems with meds      PMDD (premenstrual dysphoric disorder) - Primary    lexapro 10 mg qd  F/u 6 months or sooner prn       Relevant Medications   escitalopram (LEXAPRO) 10 MG tablet   Other Visit Diagnoses     Attention deficit disorder (ADD) in adult       Relevant Medications   lisdexamfetamine (VYVANSE) 30 MG capsule   lisdexamfetamine (VYVANSE) 30 MG capsule   lisdexamfetamine (VYVANSE) 30 MG capsule   Other Relevant Orders   Drug Monitoring Panel [phone] , Urine       Meds ordered this encounter  Medications   lisdexamfetamine (VYVANSE) 30 MG capsule    Sig: Take 1 capsule (30 mg total) by mouth daily.    Dispense:  30 capsule    Refill:  0    Do not fill until dec 2022   lisdexamfetamine (VYVANSE) 30 MG capsule    Sig: Take 1 capsule (30 mg total) by mouth daily.    Dispense:  30 capsule    Refill:  0    Do not fill until nov 2022   lisdexamfetamine (VYVANSE) 30 MG capsule    Sig: Take 1 capsule (30 mg total) by  mouth daily.    Dispense:  30 capsule    Refill:  0    Do not fill until oct 2022   escitalopram (LEXAPRO) 10 MG tablet    Sig: Take 1 tablet (10 mg total) by mouth daily.    Dispense:  90 tablet    Refill:  3    I,[name],acting as a Neurosurgeon for [employer], DO.,have documented all relevant documentation on the behalf of [name], DO,as directed by  [name], DO while in the presence of [name], DO.   I, [name], DO. , personally preformed the services described in this documentation.  All medical record entries made by the scribe were at my direction and in my presence.  I have reviewed the chart and discharge instructions (if applicable) and agree that the record reflects my personal performance and is accurate and complete. 11/14/2020

## 2020-11-20 DIAGNOSIS — E0590 Thyrotoxicosis, unspecified without thyrotoxic crisis or storm: Secondary | ICD-10-CM | POA: Diagnosis not present

## 2020-11-21 LAB — DM TEMPLATE

## 2020-11-21 LAB — DRUG MONITORING PANEL 376104, URINE
Amphetamine: 1750 ng/mL — ABNORMAL HIGH (ref <250)
Amphetamines: POSITIVE ng/mL — AB (ref <500)
Barbiturates: NEGATIVE ng/mL (ref <300)
Benzodiazepines: NEGATIVE ng/mL (ref <100)
Cocaine Metabolite: NEGATIVE ng/mL (ref <150)
Desmethyltramadol: NEGATIVE ng/mL (ref <100)
Methamphetamine: NEGATIVE ng/mL (ref <250)
Opiates: NEGATIVE ng/mL (ref <100)
Oxycodone: NEGATIVE ng/mL (ref <100)
Tramadol: NEGATIVE ng/mL (ref <100)

## 2021-01-15 ENCOUNTER — Other Ambulatory Visit: Payer: Self-pay | Admitting: Family Medicine

## 2021-01-15 ENCOUNTER — Telehealth: Payer: Self-pay | Admitting: Family Medicine

## 2021-01-15 DIAGNOSIS — Z20828 Contact with and (suspected) exposure to other viral communicable diseases: Secondary | ICD-10-CM

## 2021-01-15 MED ORDER — OSELTAMIVIR PHOSPHATE 75 MG PO CAPS: 75.0000 mg | ORAL_CAPSULE | Freq: Every day | ORAL | 0 refills | Status: DC

## 2021-01-15 NOTE — Telephone Encounter (Signed)
Pt states all her kids are + for the flu and the pediatrician reccommended she get prescribed tamiflu as a preventative, since she does not yet have symptoms. Please advise.

## 2021-01-15 NOTE — Telephone Encounter (Signed)
Please advse 

## 2021-02-03 ENCOUNTER — Encounter: Payer: Self-pay | Admitting: Family Medicine

## 2021-02-03 ENCOUNTER — Other Ambulatory Visit: Payer: Self-pay | Admitting: Family Medicine

## 2021-02-03 DIAGNOSIS — F988 Other specified behavioral and emotional disorders with onset usually occurring in childhood and adolescence: Secondary | ICD-10-CM

## 2021-02-03 MED ORDER — LISDEXAMFETAMINE DIMESYLATE 40 MG PO CAPS
40.0000 mg | ORAL_CAPSULE | ORAL | 0 refills | Status: DC
Start: 2021-02-03 — End: 2021-03-03

## 2021-02-11 DIAGNOSIS — D229 Melanocytic nevi, unspecified: Secondary | ICD-10-CM | POA: Diagnosis not present

## 2021-02-11 DIAGNOSIS — D2371 Other benign neoplasm of skin of right lower limb, including hip: Secondary | ICD-10-CM | POA: Diagnosis not present

## 2021-02-11 DIAGNOSIS — I781 Nevus, non-neoplastic: Secondary | ICD-10-CM | POA: Diagnosis not present

## 2021-02-11 DIAGNOSIS — D1801 Hemangioma of skin and subcutaneous tissue: Secondary | ICD-10-CM | POA: Diagnosis not present

## 2021-02-11 DIAGNOSIS — L280 Lichen simplex chronicus: Secondary | ICD-10-CM | POA: Diagnosis not present

## 2021-02-11 DIAGNOSIS — D485 Neoplasm of uncertain behavior of skin: Secondary | ICD-10-CM | POA: Diagnosis not present

## 2021-03-03 ENCOUNTER — Telehealth: Payer: BC Managed Care – PPO | Admitting: Physician Assistant

## 2021-03-03 ENCOUNTER — Other Ambulatory Visit: Payer: Self-pay | Admitting: Family Medicine

## 2021-03-03 DIAGNOSIS — J019 Acute sinusitis, unspecified: Secondary | ICD-10-CM

## 2021-03-03 DIAGNOSIS — B9689 Other specified bacterial agents as the cause of diseases classified elsewhere: Secondary | ICD-10-CM | POA: Diagnosis not present

## 2021-03-03 DIAGNOSIS — F988 Other specified behavioral and emotional disorders with onset usually occurring in childhood and adolescence: Secondary | ICD-10-CM

## 2021-03-03 MED ORDER — AMOXICILLIN-POT CLAVULANATE 875-125 MG PO TABS: 1.0000 | ORAL_TABLET | Freq: Two times a day (BID) | ORAL | 0 refills | Status: DC

## 2021-03-03 MED ORDER — LISDEXAMFETAMINE DIMESYLATE 40 MG PO CAPS: 40.0000 mg | ORAL_CAPSULE | ORAL | 0 refills | Status: DC

## 2021-03-03 NOTE — Progress Notes (Signed)

## 2021-03-03 NOTE — Telephone Encounter (Signed)
Requesting: Vyvanse 40mg   Contract: 11/14/2020 UDS: 11/14/2020 Last Visit: 11/14/2020 Next Visit: 05/22/2021 Last Refill: 02/03/2021 #30 and 0RF  Please Advise

## 2021-04-02 ENCOUNTER — Telehealth: Payer: Self-pay | Admitting: Family Medicine

## 2021-04-11 ENCOUNTER — Encounter: Payer: Self-pay | Admitting: Family Medicine

## 2021-04-13 DIAGNOSIS — Z1231 Encounter for screening mammogram for malignant neoplasm of breast: Secondary | ICD-10-CM | POA: Diagnosis not present

## 2021-04-13 MED ORDER — FLUOXETINE HCL 10 MG PO CAPS: 10.0000 mg | ORAL_CAPSULE | Freq: Every day | ORAL | 0 refills | Status: DC

## 2021-04-15 NOTE — Telephone Encounter (Signed)
Opened in error

## 2021-05-06 ENCOUNTER — Other Ambulatory Visit: Payer: Self-pay | Admitting: Family Medicine

## 2021-05-12 ENCOUNTER — Other Ambulatory Visit: Payer: Self-pay | Admitting: Family Medicine

## 2021-05-12 DIAGNOSIS — F988 Other specified behavioral and emotional disorders with onset usually occurring in childhood and adolescence: Secondary | ICD-10-CM

## 2021-05-12 NOTE — Telephone Encounter (Signed)
Requesting: Vyvanse 40mg   ?Contract: 11/14/2020 ?UDS: 11/14/2020 ?Last Visit: 11/14/2020 ?Next Visit: 05/22/2021 ?Last Refill: 03/03/2021 #30 and 0RF ? ?Please Advise ? ?

## 2021-05-13 MED ORDER — LISDEXAMFETAMINE DIMESYLATE 40 MG PO CAPS: 40.0000 mg | ORAL_CAPSULE | ORAL | 0 refills | Status: DC

## 2021-05-22 ENCOUNTER — Ambulatory Visit (INDEPENDENT_AMBULATORY_CARE_PROVIDER_SITE_OTHER): Payer: BC Managed Care – PPO | Admitting: Family Medicine

## 2021-05-22 ENCOUNTER — Encounter: Payer: Self-pay | Admitting: Family Medicine

## 2021-05-22 VITALS — BP 112/82 | HR 69 | Temp 98.1°F | Resp 18 | Ht 64.0 in | Wt 134.0 lb

## 2021-05-22 DIAGNOSIS — F988 Other specified behavioral and emotional disorders with onset usually occurring in childhood and adolescence: Secondary | ICD-10-CM | POA: Diagnosis not present

## 2021-05-22 DIAGNOSIS — F3281 Premenstrual dysphoric disorder: Secondary | ICD-10-CM | POA: Diagnosis not present

## 2021-05-22 DIAGNOSIS — Z1159 Encounter for screening for other viral diseases: Secondary | ICD-10-CM

## 2021-05-22 DIAGNOSIS — Z0000 Encounter for general adult medical examination without abnormal findings: Secondary | ICD-10-CM

## 2021-05-22 LAB — CBC WITH DIFFERENTIAL/PLATELET
Basophils Absolute: 0 10*3/uL (ref 0.0–0.1)
Basophils Relative: 0.5 % (ref 0.0–3.0)
Eosinophils Absolute: 0 10*3/uL (ref 0.0–0.7)
Eosinophils Relative: 0.9 % (ref 0.0–5.0)
HCT: 39.9 % (ref 36.0–46.0)
Hemoglobin: 13.4 g/dL (ref 12.0–15.0)
Lymphocytes Relative: 45.4 % (ref 12.0–46.0)
Lymphs Abs: 2 10*3/uL (ref 0.7–4.0)
MCHC: 33.5 g/dL (ref 30.0–36.0)
MCV: 87.8 fl (ref 78.0–100.0)
Monocytes Absolute: 0.4 10*3/uL (ref 0.1–1.0)
Monocytes Relative: 10.2 % (ref 3.0–12.0)
Neutro Abs: 1.9 10*3/uL (ref 1.4–7.7)
Neutrophils Relative %: 43 % (ref 43.0–77.0)
Platelets: 298 10*3/uL (ref 150.0–400.0)
RBC: 4.54 Mil/uL (ref 3.87–5.11)
RDW: 13.3 % (ref 11.5–15.5)
WBC: 4.3 10*3/uL (ref 4.0–10.5)

## 2021-05-22 LAB — LIPID PANEL
Cholesterol: 165 mg/dL (ref 0–200)
HDL: 69.4 mg/dL (ref >39.00)
LDL Cholesterol: 84 mg/dL (ref 0–99)
NonHDL: 96.05
Total CHOL/HDL Ratio: 2
Triglycerides: 58 mg/dL (ref 0.0–149.0)
VLDL: 11.6 mg/dL (ref 0.0–40.0)

## 2021-05-22 LAB — COMPREHENSIVE METABOLIC PANEL
ALT: 9 U/L (ref 0–35)
AST: 14 U/L (ref 0–37)
Albumin: 4.4 g/dL (ref 3.5–5.2)
Alkaline Phosphatase: 39 U/L (ref 39–117)
BUN: 15 mg/dL (ref 6–23)
CO2: 31 mEq/L (ref 19–32)
Calcium: 8.9 mg/dL (ref 8.4–10.5)
Chloride: 104 mEq/L (ref 96–112)
Creatinine, Ser: 0.74 mg/dL (ref 0.40–1.20)
GFR: 100.58 mL/min (ref >60.00)
Glucose, Bld: 84 mg/dL (ref 70–99)
Potassium: 4.5 mEq/L (ref 3.5–5.1)
Sodium: 138 mEq/L (ref 135–145)
Total Bilirubin: 0.5 mg/dL (ref 0.2–1.2)
Total Protein: 7.1 g/dL (ref 6.0–8.3)

## 2021-05-22 NOTE — Assessment & Plan Note (Signed)
Doing well on vyvanse.   

## 2021-05-22 NOTE — Patient Instructions (Signed)

## 2021-05-22 NOTE — Assessment & Plan Note (Signed)
On prozac and doing better  ?

## 2021-05-22 NOTE — Progress Notes (Signed)
? ?Subjective:  ? ?By signing my name below, I, Christina Villarreal, attest that this documentation has been prepared under the direction and in the presence of [name], DO. 05/22/2021   ? ? Patient ID: Christina Villarreal, female    DOB: 10-10-1980, 41 y.o.   MRN: [id number] ? ?Chief Complaint  ?Patient presents with  ? Annual Exam  ?  Pt states fasting   ? ? ?HPI ?Patient is in today for a comprehensive physical exam. ? ?She is doing well at this time. ? ?She switched to Prozac from lexapro because it increased her appetite and she was always hungry. She had gained a little weight. Endorses that Prozac is helping to stabilize her moods but she still feels anxious a little. Vyvanse is helping to manage her ADD and she would like to stop taking it on weekends.  ? ?She reports she has been having long menstrual cycles. ? ?She denies fever, hearing loss, ear pain,congestion, sinus pain, sore throat, eye pain, chest pain, palpitations, cough, shortness of breath, wheezing, nausea. vomiting, diarrhea, constipation, blood in stool, dysuria,frequency, hematuria and headaches.  ? ?She has yearly visits with the dermatologist.  ? ?She did not receive the flu vaccine. She has not received the recent Covid-19 booster vaccine. ? ?No recent changes in family medical history. No recent surgeries.  ? ?Past Medical History:  ?Diagnosis Date  ? ADD (attention deficit disorder)   ? GERD (gastroesophageal reflux disease)   ? ? ?History reviewed. No pertinent surgical history. ? ?Family History  ?Problem Relation Age of Onset  ? Crohn's disease Mother   ? Hyperlipidemia Mother   ? Alzheimer's disease Maternal Grandmother   ? Heart attack Maternal Grandfather   ? Breast cancer Paternal Grandmother   ? Lung cancer Paternal Grandfather   ?     smoker  ? COPD Paternal Grandfather   ? ? ?Social History  ? ?Socioeconomic History  ? Marital status: Married  ?  Spouse name: Not on file  ? Number of children: Not on file  ? Years of education: Not  on file  ? Highest education level: Not on file  ?Occupational History  ?  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 Smoking status: Never  ? Smokeless tobacco: Never  ?Substance and Sexual Activity  ? Alcohol use: Yes  ?  Alcohol/week: 3.0 standard drinks  ?  Types: 3 Glasses of wine per week  ? Drug use: No  ? Sexual activity: Yes  ?  Partners: Male  ?Other Topics Concern  ? Not on file  ?Social History Narrative  ? Married with 3 children   ? ?Social Determinants of Health  ? ?Financial Resource Strain: Not on file  ?Food Insecurity: Not on file  ?Transportation Needs: Not on file  ?Physical Activity: Not on file  ?Stress: Not on file  ?Social Connections: Not on file  ?Intimate Partner Violence: Not on file  ? ? ?Outpatient Medications Prior to Visit  ?Medication Sig Dispense Refill  ? FLUoxetine (PROZAC) 10 MG capsule TAKE 1 CAPSULE BY MOUTH EVERY DAY 30 capsule 0  ? lisdexamfetamine (VYVANSE) 40 MG capsule Take 1 capsule (40 mg total) by mouth every morning. 30 capsule 0  ? valACYclovir (VALTREX) 500 MG tablet Take 500 mg by mouth daily.     ? amoxicillin-clavulanate (AUGMENTIN) 875-125 MG tablet Take 1 tablet by mouth 2 (two) times daily. (Patient not taking: Reported on 05/22/2021) 14 tablet 0  ? ?No facility-administered medications prior  to visit.  ? ? ?No Known Allergies ? ?Review of Systems  ?Constitutional:  Negative for fever.  ?HENT:  Negative for congestion, ear pain, hearing loss, sinus pain and sore throat.   ?Eyes:  Negative for blurred vision and pain.  ?Respiratory:  Negative for cough, sputum production, shortness of breath and wheezing.   ?Cardiovascular:  Negative for chest pain and palpitations.  ?Gastrointestinal:  Negative for blood in stool, constipation, diarrhea, nausea and vomiting.  ?Genitourinary:  Negative for dysuria, frequency, hematuria and urgency.  ?Musculoskeletal:  Negative for back pain, falls and myalgias.  ?Neurological:  Negative for dizziness, sensory change, loss of  consciousness, weakness and headaches.  ?Endo/Heme/Allergies:  Negative for environmental allergies. Does not bruise/bleed easily.  ?Psychiatric/Behavioral:  Negative for depression and suicidal ideas. The patient is nervous/anxious. The patient does not have insomnia.   ? ?   ?Objective:  ?  ?Physical Exam ?Constitutional:   ?   General: She is not in acute distress. ?   Appearance: Normal appearance. She is not ill-appearing.  ?HENT:  ?   Head: Normocephalic and atraumatic.  ?   Right Ear: Tympanic membrane, ear canal and external ear normal.  ?   Left Ear: Tympanic membrane, ear canal and external ear normal.  ?Eyes:  ?   Extraocular Movements: Extraocular movements intact.  ?   Pupils: Pupils are equal, round, and reactive to light.  ?Cardiovascular:  ?   Rate and Rhythm: Normal rate and regular rhythm.  ?   Pulses: Normal pulses.  ?   Heart sounds: Normal heart sounds. No murmur heard. ?  No gallop.  ?Pulmonary:  ?   Effort: Pulmonary effort is normal. No respiratory distress.  ?   Breath sounds: Normal breath sounds. No wheezing, rhonchi or rales.  ?Abdominal:  ?   General: Bowel sounds are normal. There is no distension.  ?   Palpations: Abdomen is soft. There is no mass.  ?   Tenderness: There is no abdominal tenderness. There is no guarding or rebound.  ?   Hernia: No hernia is present.  ?Musculoskeletal:  ?   Cervical back: Normal range of motion and neck supple.  ?Lymphadenopathy:  ?   Cervical: No cervical adenopathy.  ?Skin: ?   General: Skin is warm and dry.  ?Neurological:  ?   Mental Status: She is alert and oriented to person, place, and time.  ?Psychiatric:     ?   Behavior: Behavior normal.  ? ? ?BP 112/82 (BP Location: Left Arm, Patient Position: Sitting, Cuff Size: Normal)   Pulse 69   Temp 98.1 ?F (36.7 ?C) (Oral)   Resp 18   Ht 5\' 4"  (1.626 m)   Wt 134 lb (60.8 kg)   LMP 05/17/2021   SpO2 99%   BMI 23.00 kg/m?  ?Wt Readings from Last 3 Encounters:  ?05/22/21 134 lb (60.8 kg)  ?11/14/20  130 lb 3.2 oz (59.1 kg)  ?01/08/20 137 lb (62.1 kg)  ? ? ?Diabetic Foot Exam - Simple   ?No data filed ?  ? ?Lab Results  ?Component Value Date  ? WBC 10.5 01/08/2020  ? HGB 13.4 01/08/2020  ? HCT 40.5 01/08/2020  ? PLT 385 01/08/2020  ? GLUCOSE 92 01/08/2020  ? CHOL 178 12/31/2019  ? TRIG 60 12/31/2019  ? HDL 71 12/31/2019  ? LDLCALC 92 12/31/2019  ? ALT 12 12/31/2019  ? AST 16 12/31/2019  ? NA 136 01/08/2020  ? K 3.6 01/08/2020  ? CL  103 01/08/2020  ? CREATININE 0.84 01/08/2020  ? BUN 17 01/08/2020  ? CO2 23 01/08/2020  ? TSH 2.00 12/31/2019  ? ? ?Lab Results  ?Component Value Date  ? TSH 2.00 12/31/2019  ? ?Lab Results  ?Component Value Date  ? WBC 10.5 01/08/2020  ? HGB 13.4 01/08/2020  ? HCT 40.5 01/08/2020  ? MCV 87.3 01/08/2020  ? PLT 385 01/08/2020  ? ?Lab Results  ?Component Value Date  ? NA 136 01/08/2020  ? K 3.6 01/08/2020  ? CO2 23 01/08/2020  ? GLUCOSE 92 01/08/2020  ? BUN 17 01/08/2020  ? CREATININE 0.84 01/08/2020  ? BILITOT 0.5 12/31/2019  ? ALKPHOS 39 04/26/2016  ? AST 16 12/31/2019  ? ALT 12 12/31/2019  ? PROT 7.3 12/31/2019  ? ALBUMIN 4.4 04/26/2016  ? CALCIUM 9.4 01/08/2020  ? ANIONGAP 10 01/08/2020  ? GFR 92.81 04/26/2016  ? ?Lab Results  ?Component Value Date  ? CHOL 178 12/31/2019  ? ?Lab Results  ?Component Value Date  ? HDL 71 12/31/2019  ? ?Lab Results  ?Component Value Date  ? LDLCALC 92 12/31/2019  ? ?Lab Results  ?Component Value Date  ? TRIG 60 12/31/2019  ? ?Lab Results  ?Component Value Date  ? CHOLHDL 2.5 12/31/2019  ? ?No results found for: HGBA1C ? ?   ? ?Mammogram- Last checked on 04/13/2021. Results were normal. Repeat in 1 year. ? ?Assessment & Plan:  ? ?Problem List Items Addressed This Visit   ? ?  ? Unprioritized  ? Preventative health care - Primary  ?  ghm utd ?Check labs  ?See avs ?covid booster today downstairs  ?  ?  ? Relevant Orders  ? CBC with Differential/Platelet  ? Comprehensive metabolic panel  ? Lipid panel  ? TSH  ? ?Other Visit Diagnoses   ? ? Need for  hepatitis C screening test      ? Relevant Orders  ? Hepatitis C antibody  ? ?  ? ? ? ?No orders of the defined types were placed in this encounter. ? ? ?I,Christina Villarreal,acting as a Neurosurgeonscribe for [employer]Yvonne R Lowne Chase, DO.,have d

## 2021-05-22 NOTE — Assessment & Plan Note (Signed)
ghm utd Check labs  See avs covid booster today downstairs   

## 2021-05-25 LAB — HEPATITIS C ANTIBODY
Hepatitis C Ab: NONREACTIVE
SIGNAL TO CUT-OFF: 0.41 (ref <1.00)

## 2021-05-26 LAB — TSH: TSH: 2.13 u[IU]/mL (ref 0.35–5.50)

## 2021-06-04 ENCOUNTER — Other Ambulatory Visit: Payer: Self-pay | Admitting: Family Medicine

## 2021-06-14 ENCOUNTER — Encounter: Payer: Self-pay | Admitting: Family Medicine

## 2021-06-14 DIAGNOSIS — F988 Other specified behavioral and emotional disorders with onset usually occurring in childhood and adolescence: Secondary | ICD-10-CM

## 2021-06-15 MED ORDER — LISDEXAMFETAMINE DIMESYLATE 40 MG PO CAPS: 40.0000 mg | ORAL_CAPSULE | ORAL | 0 refills | Status: DC

## 2021-06-15 NOTE — Telephone Encounter (Signed)
Requesting: Vyvanse  ?Contract: 11/14/2020 ?UDS: 11/14/2020 ?Last OV: 05/22/2021 ?Next OV: 08/23/2021 ?Last Refill: 05/13/2021, #30--0 RF ?Database: ? ? ?Please advise   ?

## 2021-07-02 IMAGING — CT CT HEAD W/O CM
3 series · 16 of 46 positions shown, 19 images · non-contrast
Comparison: None.

CLINICAL DATA: Recurrent syncope, head injury 1 week ago, headaches

EXAM:
CT HEAD WITHOUT CONTRAST
TECHNIQUE: Contiguous axial images were obtained from the base of the skull
through the vertex without intravenous contrast.

[Series 2: head wo · axial · 0.40mm/px · z∈[+1144,+1264]mm · 10 of 29 slices shown, 13 images]
[im 3/29  brain]
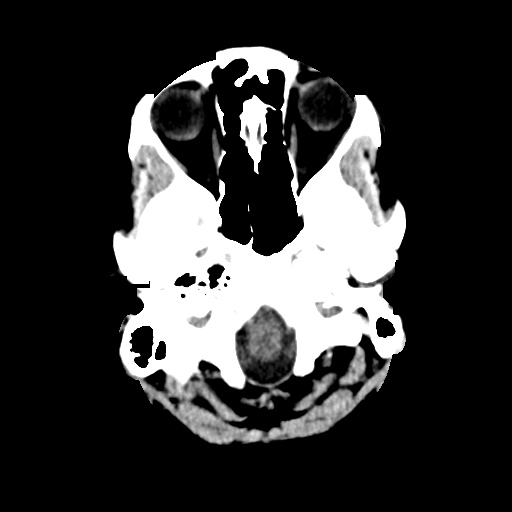
[im 3/29  bone]
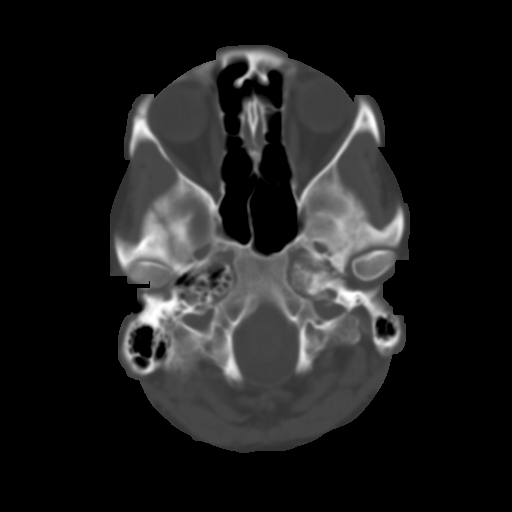
[im 6/29  brain]
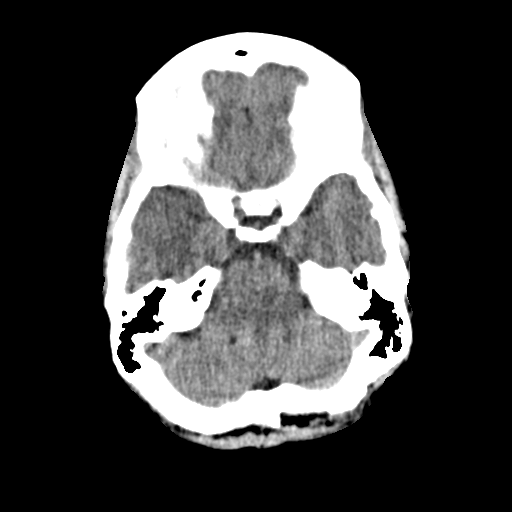
[im 8/29  brain]
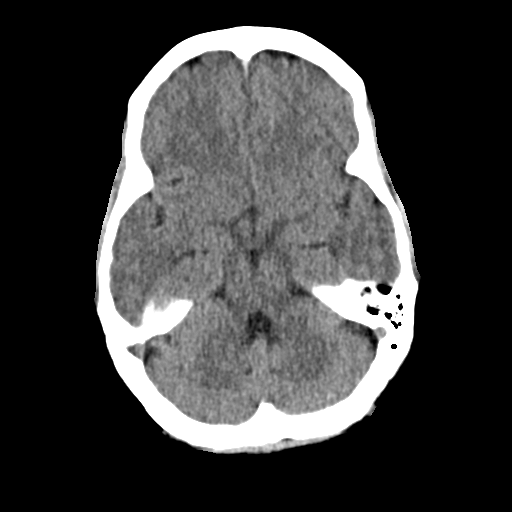
[im 11/29  brain]
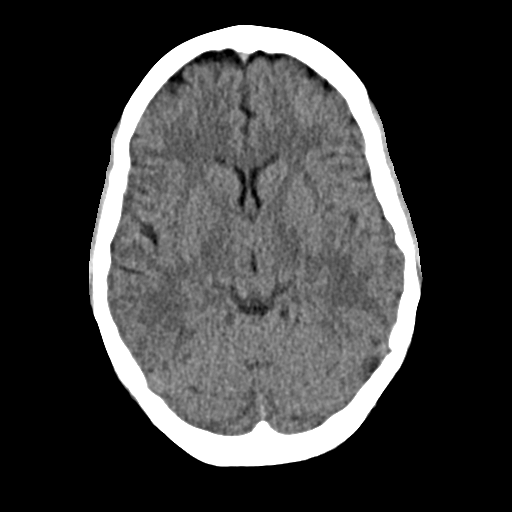
[im 14/29  brain]
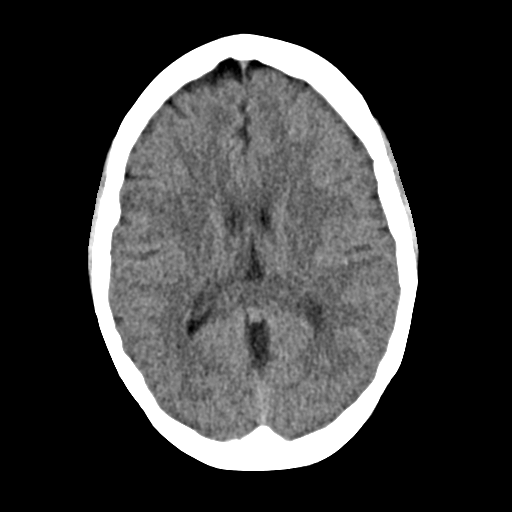
[im 14/29  bone]
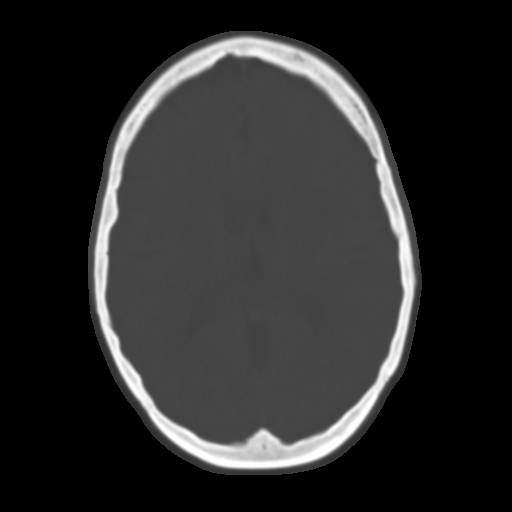
[im 16/29  brain]
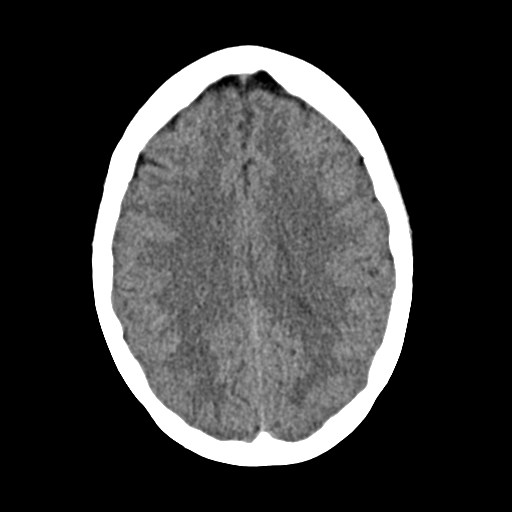
[im 19/29  brain]
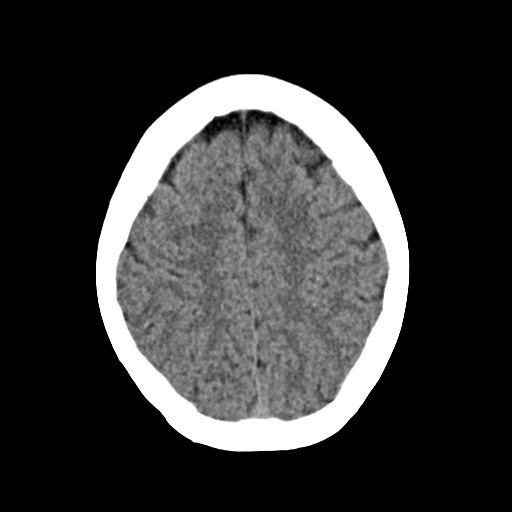
[im 22/29  brain]
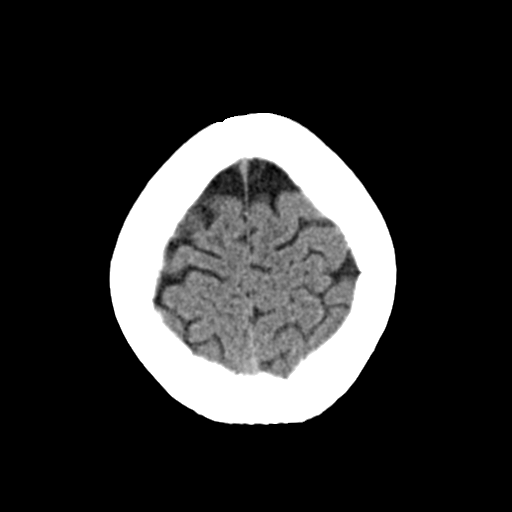
[im 24/29  brain]
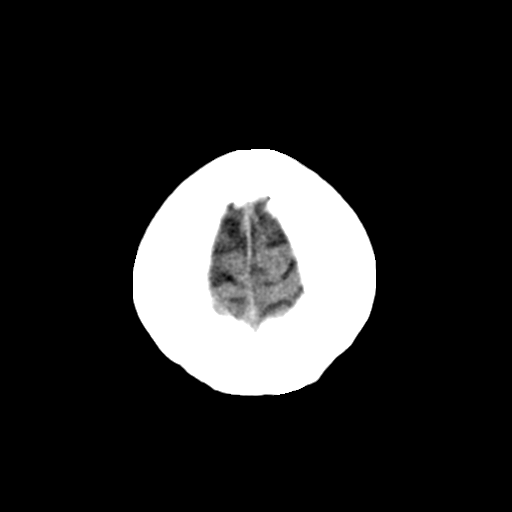
[im 24/29  bone]
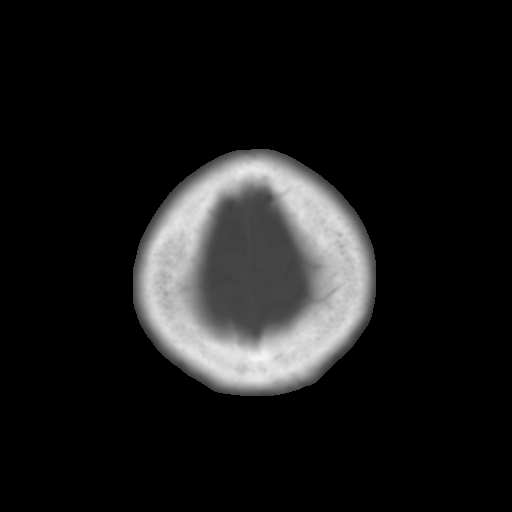
[im 27/29  brain]
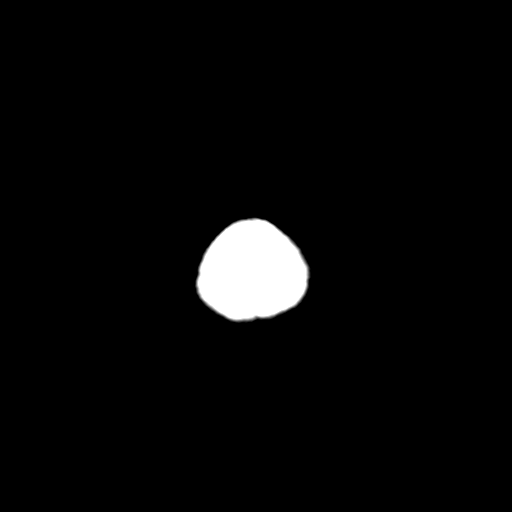

[Series 4: coronal soft · coronal · 0.31mm/px · 3 of 62 slices shown]
[im 21/62  brain]
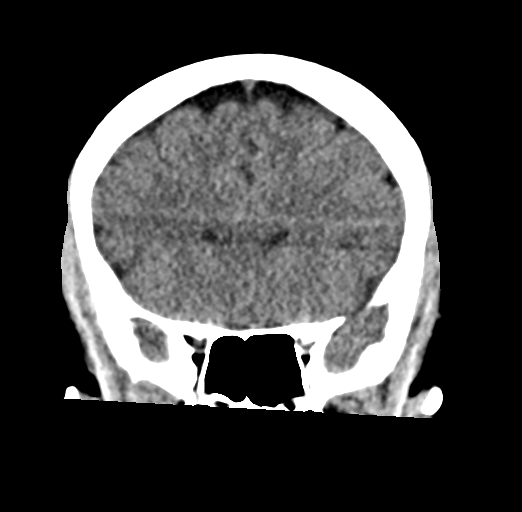
[im 28/62  brain]
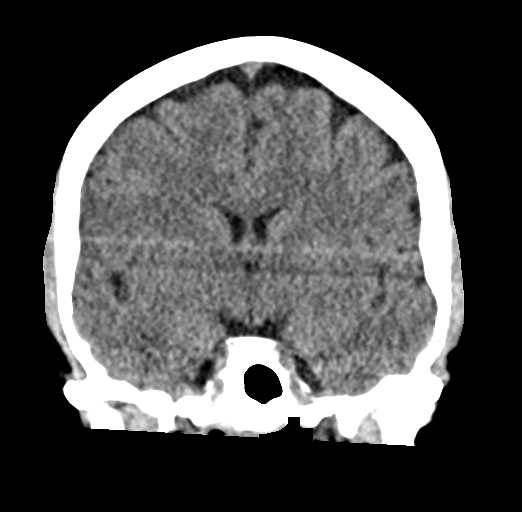
[im 34/62  brain]
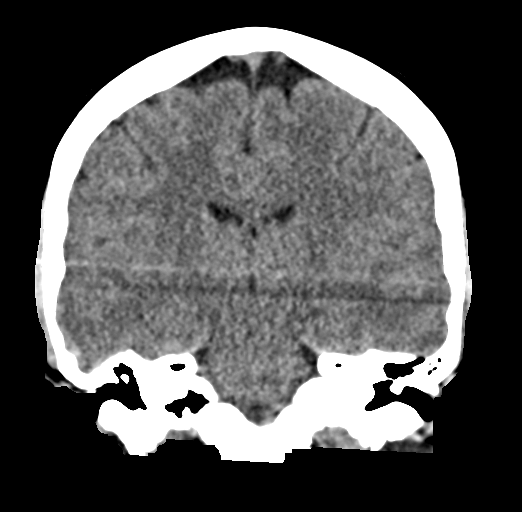

[Series 5: sag soft · sagittal · 0.32mm/px · 3 of 45 slices shown]
[im 15/45  brain]
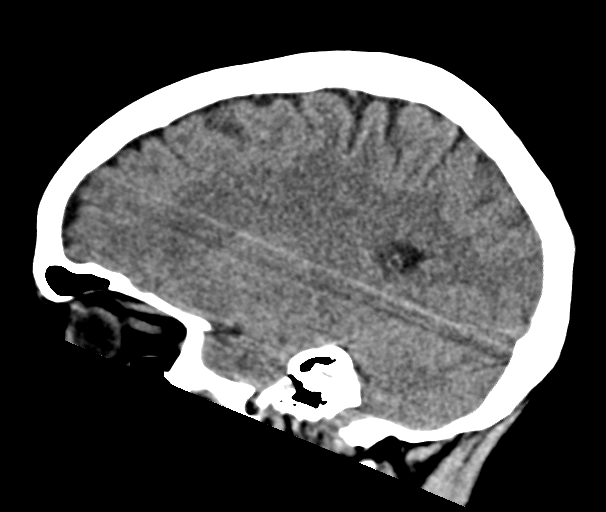
[im 23/45  brain]
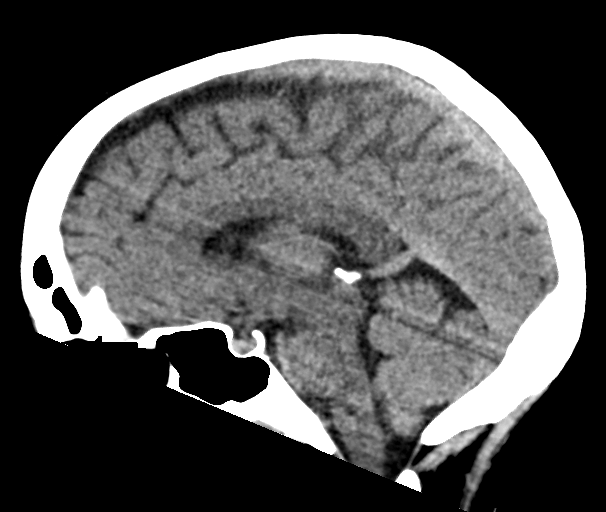
[im 30/45  brain]
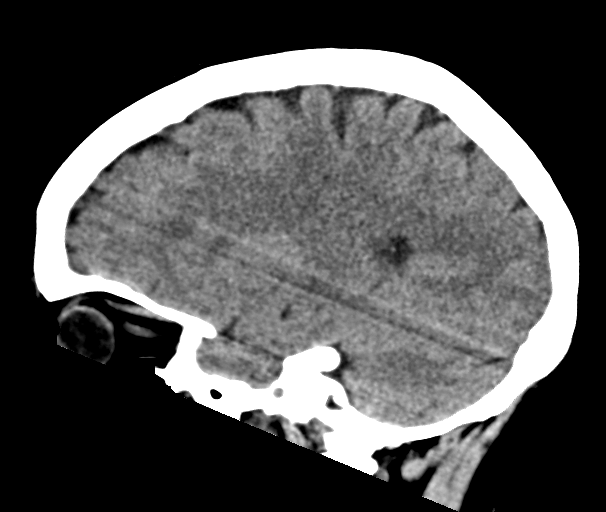

[16 of 46 positions shown; findings below may reference images not displayed]

FINDINGS: Brain: No acute infarct or hemorrhage. Lateral ventricles and
midline structures are unremarkable. No acute extra-axial fluid
collections. No mass effect.

Vascular: No hyperdense vessel or unexpected calcification.

Skull: Normal. Negative for fracture or focal lesion.

Sinuses/Orbits: No acute finding.

Other: None.
IMPRESSION: 1. No acute intracranial process.

## 2021-07-14 DIAGNOSIS — Z1151 Encounter for screening for human papillomavirus (HPV): Secondary | ICD-10-CM | POA: Diagnosis not present

## 2021-07-14 DIAGNOSIS — Z803 Family history of malignant neoplasm of breast: Secondary | ICD-10-CM | POA: Diagnosis not present

## 2021-07-14 DIAGNOSIS — Z124 Encounter for screening for malignant neoplasm of cervix: Secondary | ICD-10-CM | POA: Diagnosis not present

## 2021-07-14 DIAGNOSIS — Z01419 Encounter for gynecological examination (general) (routine) without abnormal findings: Secondary | ICD-10-CM | POA: Diagnosis not present

## 2021-07-23 ENCOUNTER — Other Ambulatory Visit: Payer: Self-pay | Admitting: Family Medicine

## 2021-07-23 DIAGNOSIS — F988 Other specified behavioral and emotional disorders with onset usually occurring in childhood and adolescence: Secondary | ICD-10-CM

## 2021-07-23 MED ORDER — LISDEXAMFETAMINE DIMESYLATE 40 MG PO CAPS: 40.0000 mg | ORAL_CAPSULE | ORAL | 0 refills | Status: DC

## 2021-07-23 NOTE — Telephone Encounter (Signed)
Requesting: vyvanse 40mg  Contract: 11/14/20 UDS:11/14/20 Last Visit:05/22/21 Next Visit: 11/23/21 Last Refill: 06/15/21  Please Advise

## 2021-08-20 ENCOUNTER — Other Ambulatory Visit: Payer: Self-pay | Admitting: Family Medicine

## 2021-08-20 DIAGNOSIS — F988 Other specified behavioral and emotional disorders with onset usually occurring in childhood and adolescence: Secondary | ICD-10-CM

## 2021-08-20 NOTE — Telephone Encounter (Signed)
Requesting: Vyvanse 40mg   Contract: 11/14/20 UDS: 11/14/20 Last Visit: 05/22/21 Next Visit: 11/23/21 Last Refill: 07/23/21 #30 and 0RF  Please Advise

## 2021-08-21 MED ORDER — LISDEXAMFETAMINE DIMESYLATE 40 MG PO CAPS: 40.0000 mg | ORAL_CAPSULE | ORAL | 0 refills | Status: DC

## 2021-09-24 ENCOUNTER — Other Ambulatory Visit: Payer: Self-pay | Admitting: Family Medicine

## 2021-09-24 DIAGNOSIS — F988 Other specified behavioral and emotional disorders with onset usually occurring in childhood and adolescence: Secondary | ICD-10-CM

## 2021-09-24 NOTE — Telephone Encounter (Signed)
Requesting: Vyvanse 40mg   Contract:11/14/20 UDS: 11/14/20 Last Visit: 05/22/21 Next Visit: 11/23/21 Last Refill: 08/23/21 #30 and 0RF  Please Advise

## 2021-09-25 MED ORDER — LISDEXAMFETAMINE DIMESYLATE 40 MG PO CAPS: 40.0000 mg | ORAL_CAPSULE | ORAL | 0 refills | Status: DC

## 2021-10-23 ENCOUNTER — Other Ambulatory Visit: Payer: Self-pay | Admitting: Family Medicine

## 2021-10-23 DIAGNOSIS — F988 Other specified behavioral and emotional disorders with onset usually occurring in childhood and adolescence: Secondary | ICD-10-CM

## 2021-10-23 NOTE — Telephone Encounter (Signed)
Requesting: Vyvanse 40mg   Contract: 11/14/20  UDS: 11/14/20 Last Visit: 05/22/21 Next Visit: 11/23/21 Last Refill: 09/25/21 #30 and 0RF  Please Advise

## 2021-10-26 MED ORDER — LISDEXAMFETAMINE DIMESYLATE 40 MG PO CAPS: 40.0000 mg | ORAL_CAPSULE | ORAL | 0 refills | Status: DC

## 2021-11-20 DIAGNOSIS — E0590 Thyrotoxicosis, unspecified without thyrotoxic crisis or storm: Secondary | ICD-10-CM | POA: Diagnosis not present

## 2021-11-23 ENCOUNTER — Encounter: Payer: Self-pay | Admitting: Family Medicine

## 2021-11-23 ENCOUNTER — Ambulatory Visit: Payer: BC Managed Care – PPO | Admitting: Family Medicine

## 2021-11-23 VITALS — BP 120/80 | HR 102 | Temp 98.8°F | Resp 18 | Ht 64.0 in | Wt 138.8 lb

## 2021-11-23 DIAGNOSIS — F988 Other specified behavioral and emotional disorders with onset usually occurring in childhood and adolescence: Secondary | ICD-10-CM

## 2021-11-23 DIAGNOSIS — Z79899 Other long term (current) drug therapy: Secondary | ICD-10-CM

## 2021-11-23 MED ORDER — LISDEXAMFETAMINE DIMESYLATE 40 MG PO CAPS: 40.0000 mg | ORAL_CAPSULE | ORAL | 0 refills | Status: DC

## 2021-11-23 NOTE — Assessment & Plan Note (Signed)
con't vyvase for now  May need to switch to focalin --- pt will let us know

## 2021-11-23 NOTE — Progress Notes (Signed)
Subjective:   By signing my name below, I, Cassell Clement, attest that this documentation has been prepared under the direction and in the presence of Seabron Spates R DO 11/23/2021.      Patient ID: Christina Villarreal, female    DOB: 1980-04-09, 41 y.o.   MRN: [id number]  No chief complaint on file.   HPI Patient is in today for office visit.  Patient states that the 40 mg of Vyvanse is working fine, but due to her insurance the copay is increasing. She is interested in changing her medication.  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  Past Medical History:  Diagnosis Date  . ADD (attention deficit disorder)   . GERD (gastroesophageal reflux disease)     No past surgical history on file.  Family History  Problem Relation Age of Onset  . Crohn's disease Mother   . Hyperlipidemia Mother   . Alzheimer's disease Maternal Grandmother   . Heart attack Maternal Grandfather   . Breast cancer Paternal Grandmother   . Lung cancer Paternal Grandfather        smoker  . COPD Paternal Grandfather     Social History   Socioeconomic History  . Marital status: Married    Spouse name: Not on file  . Number of children: Not on file  . Years of education: Not on file  . 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 Smoking status: Never  . Smokeless tobacco: Never  Substance and Sexual Activity  . Alcohol use: Yes    Alcohol/week: 3.0 standard drinks of alcohol    Types: 3 Glasses of wine per week  . Drug use: No  . Sexual activity: Yes    Partners: Male  Other Topics Concern  . Not on file  Social History Narrative   Married with 3 children    Social Determinants of Health   Financial Resource Strain: Not on file  Food Insecurity: Not on file  Transportation Needs: Not on file  Physical Activity: Not on file  Stress: Not on file  Social Connections: Not on file  Intimate Partner Violence:  Not on file    Outpatient Medications Prior to Visit  Medication Sig Dispense Refill  . FLUoxetine (PROZAC) 10 MG capsule TAKE 1 CAPSULE BY MOUTH EVERY DAY 90 capsule 1  . lisdexamfetamine (VYVANSE) 40 MG capsule Take 1 capsule (40 mg total) by mouth every morning. 30 capsule 0  . valACYclovir (VALTREX) 500 MG tablet Take 500 mg by mouth daily.      No facility-administered medications prior to visit.    No Known Allergies  Review of Systems  Constitutional:  Negative for fever and malaise/fatigue.  HENT:  Negative for congestion.   Eyes:  Negative for blurred vision.  Respiratory:  Negative for shortness of breath.   Cardiovascular:  Negative for chest pain, palpitations and leg swelling.  Gastrointestinal:  Negative for abdominal pain, blood in stool and nausea.  Genitourinary:  Negative for dysuria and frequency.  Musculoskeletal:  Negative for falls.  Skin:  Negative for rash.  Neurological:  Negative for dizziness, loss of consciousness and headaches.  Endo/Heme/Allergies:  Negative for environmental allergies.  Psychiatric/Behavioral:  Negative for depression. The patient is not nervous/anxious.       Objective:    Physical Exam Vitals and nursing note reviewed.  Constitutional:      General: She is not  in acute distress.    Appearance: Normal appearance. She is well-developed. She is not ill-appearing.  HENT:     Head: Normocephalic and atraumatic.     Right Ear: External ear normal.     Left Ear: External ear normal.  Eyes:     Extraocular Movements: Extraocular movements intact.     Conjunctiva/sclera: Conjunctivae normal.     Pupils: Pupils are equal, round, and reactive to light.  Neck:     Thyroid: No thyromegaly.     Vascular: No carotid bruit or JVD.  Cardiovascular:     Rate and Rhythm: Normal rate and regular rhythm.     Heart sounds: Normal heart sounds. No murmur heard.    No gallop.  Pulmonary:     Effort: Pulmonary effort is normal. No  respiratory distress.     Breath sounds: Normal breath sounds. No wheezing or rales.  Chest:     Chest wall: No tenderness.  Musculoskeletal:     Cervical back: Normal range of motion and neck supple.  Skin:    General: Skin is warm and dry.  Neurological:     Mental Status: She is alert and oriented to person, place, and time.  Psychiatric:        Judgment: Judgment normal.    There were no vitals taken for this visit. Wt Readings from Last 3 Encounters:  05/22/21 134 lb (60.8 kg)  11/14/20 130 lb 3.2 oz (59.1 kg)  01/08/20 137 lb (62.1 kg)    Diabetic Foot Exam - Simple   No data filed    Lab Results  Component Value Date   WBC 4.3 05/22/2021   HGB 13.4 05/22/2021   HCT 39.9 05/22/2021   PLT 298.0 05/22/2021   GLUCOSE 84 05/22/2021   CHOL 165 05/22/2021   TRIG 58.0 05/22/2021   HDL 69.40 05/22/2021   LDLCALC 84 05/22/2021   ALT 9 05/22/2021   AST 14 05/22/2021   NA 138 05/22/2021   K 4.5 05/22/2021   CL 104 05/22/2021   CREATININE 0.74 05/22/2021   BUN 15 05/22/2021   CO2 31 05/22/2021   TSH 2.13 05/22/2021    Lab Results  Component Value Date   TSH 2.13 05/22/2021   Lab Results  Component Value Date   WBC 4.3 05/22/2021   HGB 13.4 05/22/2021   HCT 39.9 05/22/2021   MCV 87.8 05/22/2021   PLT 298.0 05/22/2021   Lab Results  Component Value Date   NA 138 05/22/2021   K 4.5 05/22/2021   CO2 31 05/22/2021   GLUCOSE 84 05/22/2021   BUN 15 05/22/2021   CREATININE 0.74 05/22/2021   BILITOT 0.5 05/22/2021   ALKPHOS 39 05/22/2021   AST 14 05/22/2021   ALT 9 05/22/2021   PROT 7.1 05/22/2021   ALBUMIN 4.4 05/22/2021   CALCIUM 8.9 05/22/2021   ANIONGAP 10 01/08/2020   GFR 100.58 05/22/2021   Lab Results  Component Value Date   CHOL 165 05/22/2021   Lab Results  Component Value Date   HDL 69.40 05/22/2021   Lab Results  Component Value Date   LDLCALC 84 05/22/2021   Lab Results  Component Value Date   TRIG 58.0 05/22/2021   Lab Results   Component Value Date   CHOLHDL 2 05/22/2021   No results found for: "HGBA1C"     Assessment & Plan:   Problem List Items Addressed This Visit   None     No orders of the defined types were  placed in this encounter.   I, Carylon Perches, personally preformed the services described in this documentation.  All medical record entries made by the scribe were at my direction and in my presence.  I have reviewed the chart and discharge instructions (if applicable) and agree that the record reflects my personal performance and is accurate and complete. 11/23/2021   I,Amber Collins,acting as a scribe for Ann Held, DO.,have documented all relevant documentation on the behalf of Ann Held, DO,as directed by  Ann Held, DO while in the presence of Ann Held, DO.    [employer]

## 2021-11-23 NOTE — Patient Instructions (Signed)
Living With Attention Deficit Hyperactivity Disorder If you have been diagnosed with attention deficit hyperactivity disorder (ADHD), you may be relieved that you now know why you have felt or behaved a certain way. Still, you may feel overwhelmed about the treatment ahead. You may also wonder how to get the support you need and how to deal with the condition day-to-day. With treatment and support, you can live with ADHD and manage your symptoms. How to manage lifestyle changes Managing lifestyle changes can be challenging. Seeking support from your healthcare provider, therapist, family, and friends can be helpful. How to recognize changes in your condition The following signs may mean that your treatment is working well and your condition is improving: Consistently being on time for appointments. Being more organized at home and work. Other people noticing improvements in your behavior. Achieving goals that you set for yourself. Thinking more clearly. The following signs may mean that your treatment is not working very well: Feeling impatience or more confusion. Missing, forgetting, or being late for appointments. An increasing sense of disorganization and messiness. More difficulty in reaching goals that you set for yourself. Loved ones becoming angry or frustrated with you. Follow these instructions at home: Medicines Take over-the-counter and prescription medicines only as told by your health care provider. Check with your health care provider before taking any new medicines. General instructions Create structure and an organized atmosphere at home. For example: Make a list of tasks, then rank them from most important to least important. Work on one task at a time until your listed tasks are done. Make a daily schedule and follow it consistently every day. Use an appointment calendar, and check it 2-3 times a day to keep on track. Keep it with you when you leave the house. Create  spaces where you keep certain things, and always put things back in their places after you use them. Keep all follow-up visits. Your health care provider will need to monitor your condition and adjust your treatment over time. Where to find support Talking to others  Keep emotion out of important discussions and speak in a calm, logical way. Listen closely and patiently to your loved ones. Try to understand their point of view, and try to avoid getting defensive. Take responsibility for the consequences of your actions. Ask that others do not take your behaviors personally. Aim to solve problems as they come up, and express your feelings instead of bottling them up. Talk openly about what you need from your loved ones and how they can support you. Consider going to family therapy sessions or having your family meet with a specialist who deals with ADHD-related behavior problems. Finances Not all insurance plans cover mental health care, so it is important to check with your insurance carrier. If paying for co-pays or counseling services is a problem, search for a local or county mental health care center. Public mental health care services may be offered there at a low cost or no cost when you are not able to see a private health care provider. If you are taking medicine for ADHD, you may be able to get the generic form, which may be less expensive than brand-name medicine. Some makers of prescription medicines also offer help to patients who cannot afford the medicines that they need. Therapy and support groups Talking with a mental health care provider and participating in support groups can help to improve your quality of life, daily functioning, and overall symptoms. Questions to ask your health   care provider: What are the risks and benefits of taking medicines? Would I benefit from therapy? How often should I follow up with a health care provider? Where to find more information Learn more  about ADHD from: Children and Adults with Attention Deficit Hyperactivity Disorder: chadd.org National Institute of Mental Health: nimh.nih.gov Centers for Disease Control and Prevention: cdc.gov Contact a health care provider if: You have side effects from your medicines, such as: Repeated muscle twitches, coughing, or speech outbursts. Sleep problems. Loss of appetite. Dizziness. Unusually fast heartbeat. Stomach pains. Headaches. You have new or worsening behavior problems. You are struggling with anxiety, depression, or substance abuse. Get help right away if: You have a severe reaction to a medicine. These symptoms may be an emergency. Get help right away. Call 911. Do not wait to see if the symptoms will go away. Do not drive yourself to the hospital. Take one of these steps if you feel like you may hurt yourself or others, or have thoughts about taking your own life: Go to your nearest emergency room. Call 911. Call the National Suicide Prevention Lifeline at 1-800-273-8255 or 988. This is open 24 hours a day. Text the Crisis Text Line at 741741. Summary With treatment and support, you can live with ADHD and manage your symptoms. Consider taking part in family therapy or self-help groups with family members or friends. When you talk with friends and family about your ADHD, be patient and communicate openly. Keep all follow-up visits. Your health care provider will need to monitor your condition and adjust your treatment over time. This information is not intended to replace advice given to you by your health care provider. Make sure you discuss any questions you have with your health care provider. Document Revised: 06/05/2021 Document Reviewed: 06/05/2021 Elsevier Patient Education  2023 Elsevier Inc.  

## 2021-11-25 LAB — DRUG MONITORING PANEL 376104, URINE
Amphetamine: 10510 ng/mL — ABNORMAL HIGH (ref <250)
Amphetamines: POSITIVE ng/mL — AB (ref <500)
Barbiturates: NEGATIVE ng/mL (ref <300)
Benzodiazepines: NEGATIVE ng/mL (ref <100)
Cocaine Metabolite: NEGATIVE ng/mL (ref <150)
Desmethyltramadol: NEGATIVE ng/mL (ref <100)
Methamphetamine: NEGATIVE ng/mL (ref <250)
Opiates: NEGATIVE ng/mL (ref <100)
Oxycodone: NEGATIVE ng/mL (ref <100)
Tramadol: NEGATIVE ng/mL (ref <100)

## 2021-11-25 LAB — DM TEMPLATE

## 2021-12-28 ENCOUNTER — Other Ambulatory Visit: Payer: Self-pay | Admitting: Family Medicine

## 2021-12-28 DIAGNOSIS — F988 Other specified behavioral and emotional disorders with onset usually occurring in childhood and adolescence: Secondary | ICD-10-CM

## 2021-12-28 MED ORDER — LISDEXAMFETAMINE DIMESYLATE 40 MG PO CAPS: 40.0000 mg | ORAL_CAPSULE | ORAL | 0 refills | Status: DC

## 2021-12-28 NOTE — Telephone Encounter (Signed)
Requesting: Vyvanse 40mg   Contract: 11/23/21 UDS: 11/23/21 Last Visit: 11/23/21 Next Visit:  05/25/22 Last Refill: 11/23/21 #30 and 0RF   Please Advise

## 2022-01-26 ENCOUNTER — Other Ambulatory Visit: Payer: Self-pay | Admitting: Family Medicine

## 2022-01-26 DIAGNOSIS — F988 Other specified behavioral and emotional disorders with onset usually occurring in childhood and adolescence: Secondary | ICD-10-CM

## 2022-02-01 ENCOUNTER — Other Ambulatory Visit: Payer: Self-pay | Admitting: Family Medicine

## 2022-02-01 ENCOUNTER — Other Ambulatory Visit (HOSPITAL_COMMUNITY): Payer: Self-pay

## 2022-02-01 ENCOUNTER — Telehealth: Payer: Self-pay | Admitting: Family Medicine

## 2022-02-01 DIAGNOSIS — F988 Other specified behavioral and emotional disorders with onset usually occurring in childhood and adolescence: Secondary | ICD-10-CM

## 2022-02-01 MED ORDER — LISDEXAMFETAMINE DIMESYLATE 40 MG PO CAPS
40.0000 mg | ORAL_CAPSULE | ORAL | 0 refills | Status: DC
  Filled 2022-02-01: qty 30, 30d supply, fill #0

## 2022-02-01 NOTE — Telephone Encounter (Signed)
Pt called to have the following Rx rerouted to the following pharmacy due to her pharmacy being out of stock: Prescription Request  02/01/2022  Is this a "Controlled Substance" medicine? Yes  LOV: 11/23/2021  What is the name of the medication or equipment?   lisdexamfetamine (VYVANSE) 40 MG capsule [[id number]]   Have you contacted your pharmacy to request a refill? Yes   Which pharmacy would you like this sent to?   [facility] [address] [place], [place], [place] [address] P: [phone]  Patient notified that their request is being sent to the clinical staff for review and that they should receive a response within 2 business days.   Please advise at Mobile [phone] (mobile)

## 2022-02-01 NOTE — Telephone Encounter (Signed)
Refill sent.

## 2022-02-18 ENCOUNTER — Telehealth: Payer: BC Managed Care – PPO | Admitting: Physician Assistant

## 2022-02-18 DIAGNOSIS — Z20818 Contact with and (suspected) exposure to other bacterial communicable diseases: Secondary | ICD-10-CM | POA: Diagnosis not present

## 2022-02-18 DIAGNOSIS — J029 Acute pharyngitis, unspecified: Secondary | ICD-10-CM

## 2022-02-18 MED ORDER — AMOXICILLIN 500 MG PO TABS: 500.0000 mg | ORAL_TABLET | Freq: Two times a day (BID) | ORAL | 0 refills | Status: AC

## 2022-02-18 NOTE — Progress Notes (Signed)

## 2022-02-18 NOTE — Progress Notes (Signed)
I have spent 5 minutes in review of e-visit questionnaire, review and updating patient chart, medical decision making and response to patient.   Anneth Brunell Cody Jomarion Mish, PA-C    

## 2022-03-05 ENCOUNTER — Other Ambulatory Visit (HOSPITAL_COMMUNITY): Payer: Self-pay

## 2022-03-05 ENCOUNTER — Other Ambulatory Visit: Payer: Self-pay | Admitting: Family Medicine

## 2022-03-05 DIAGNOSIS — F988 Other specified behavioral and emotional disorders with onset usually occurring in childhood and adolescence: Secondary | ICD-10-CM

## 2022-03-05 MED ORDER — LISDEXAMFETAMINE DIMESYLATE 40 MG PO CAPS
40.0000 mg | ORAL_CAPSULE | ORAL | 0 refills | Status: DC
  Filled 2022-03-05: qty 30, 30d supply, fill #0

## 2022-03-08 ENCOUNTER — Other Ambulatory Visit (HOSPITAL_COMMUNITY): Payer: Self-pay

## 2022-03-16 ENCOUNTER — Other Ambulatory Visit (HOSPITAL_COMMUNITY): Payer: Self-pay

## 2022-03-16 ENCOUNTER — Encounter: Payer: Self-pay | Admitting: Family Medicine

## 2022-03-16 ENCOUNTER — Other Ambulatory Visit: Payer: Self-pay | Admitting: Family Medicine

## 2022-03-16 MED ORDER — OSELTAMIVIR PHOSPHATE 75 MG PO CAPS
75.0000 mg | ORAL_CAPSULE | Freq: Two times a day (BID) | ORAL | 0 refills | Status: DC
  Filled 2022-03-16: qty 10, 5d supply, fill #0

## 2022-04-04 ENCOUNTER — Other Ambulatory Visit: Payer: Self-pay | Admitting: Family Medicine

## 2022-04-04 DIAGNOSIS — F988 Other specified behavioral and emotional disorders with onset usually occurring in childhood and adolescence: Secondary | ICD-10-CM

## 2022-04-05 NOTE — Telephone Encounter (Signed)
Requesting: Vyvanse Contract: 11/23/2021 UDS: 11/23/2021 Last OV: 11/23/2021 Next OV: 06/28/2022 Last Refill: 03/05/2022, #30---0 RF Database:   Please advise

## 2022-04-06 ENCOUNTER — Other Ambulatory Visit (HOSPITAL_COMMUNITY): Payer: Self-pay

## 2022-04-06 MED ORDER — LISDEXAMFETAMINE DIMESYLATE 40 MG PO CAPS
40.0000 mg | ORAL_CAPSULE | ORAL | 0 refills | Status: DC
  Filled 2022-04-06: qty 30, 30d supply, fill #0

## 2022-04-08 ENCOUNTER — Other Ambulatory Visit (HOSPITAL_BASED_OUTPATIENT_CLINIC_OR_DEPARTMENT_OTHER): Payer: Self-pay

## 2022-04-08 ENCOUNTER — Encounter: Payer: Self-pay | Admitting: Family Medicine

## 2022-04-08 ENCOUNTER — Other Ambulatory Visit (HOSPITAL_COMMUNITY): Payer: Self-pay

## 2022-04-08 DIAGNOSIS — F988 Other specified behavioral and emotional disorders with onset usually occurring in childhood and adolescence: Secondary | ICD-10-CM

## 2022-04-08 MED ORDER — LISDEXAMFETAMINE DIMESYLATE 40 MG PO CAPS
40.0000 mg | ORAL_CAPSULE | ORAL | 0 refills | Status: DC
  Filled 2022-04-08: qty 27, 27d supply, fill #0
  Filled 2022-04-08: qty 10, 10d supply, fill #0
  Filled 2022-04-08: qty 20, 20d supply, fill #0

## 2022-04-19 ENCOUNTER — Other Ambulatory Visit: Payer: Self-pay | Admitting: Family Medicine

## 2022-05-05 ENCOUNTER — Other Ambulatory Visit: Payer: Self-pay | Admitting: Family Medicine

## 2022-05-05 DIAGNOSIS — F988 Other specified behavioral and emotional disorders with onset usually occurring in childhood and adolescence: Secondary | ICD-10-CM

## 2022-05-06 ENCOUNTER — Other Ambulatory Visit (HOSPITAL_BASED_OUTPATIENT_CLINIC_OR_DEPARTMENT_OTHER): Payer: Self-pay

## 2022-05-06 MED ORDER — LISDEXAMFETAMINE DIMESYLATE 40 MG PO CAPS
40.0000 mg | ORAL_CAPSULE | ORAL | 0 refills | Status: DC
  Filled 2022-05-06: qty 30, 30d supply, fill #0

## 2022-05-06 NOTE — Telephone Encounter (Signed)
Requesting: Vyvanse '40mg'$  Contract: 11/23/21 UDS: 11/23/21 Last Visit: 11/23/21 Next Visit: 06/28/22 Last Refill: 04/08/22 #30 and 0RF   Please Advise

## 2022-05-18 DIAGNOSIS — Z1231 Encounter for screening mammogram for malignant neoplasm of breast: Secondary | ICD-10-CM | POA: Diagnosis not present

## 2022-05-18 LAB — HM MAMMOGRAPHY

## 2022-05-25 ENCOUNTER — Encounter: Payer: BC Managed Care – PPO | Admitting: Family Medicine

## 2022-05-31 ENCOUNTER — Ambulatory Visit: Payer: BC Managed Care – PPO | Admitting: Family Medicine

## 2022-06-02 DIAGNOSIS — L218 Other seborrheic dermatitis: Secondary | ICD-10-CM | POA: Diagnosis not present

## 2022-06-07 ENCOUNTER — Telehealth: Payer: Self-pay | Admitting: Family Medicine

## 2022-06-07 DIAGNOSIS — F988 Other specified behavioral and emotional disorders with onset usually occurring in childhood and adolescence: Secondary | ICD-10-CM

## 2022-06-07 MED ORDER — LISDEXAMFETAMINE DIMESYLATE 40 MG PO CAPS: 40.0000 mg | ORAL_CAPSULE | ORAL | 0 refills | Status: DC

## 2022-06-07 NOTE — Telephone Encounter (Signed)
  Patient comment: I was wondering if this can be written as 2 (20 mg) pills daily (qty: 60 total). I would like to have the option of reducing my dose on the weekends like if I want to only take 20 mg on certain days.

## 2022-06-09 ENCOUNTER — Other Ambulatory Visit: Payer: Self-pay | Admitting: Family Medicine

## 2022-06-09 ENCOUNTER — Other Ambulatory Visit: Payer: Self-pay

## 2022-06-09 ENCOUNTER — Other Ambulatory Visit (HOSPITAL_BASED_OUTPATIENT_CLINIC_OR_DEPARTMENT_OTHER): Payer: Self-pay

## 2022-06-09 DIAGNOSIS — F988 Other specified behavioral and emotional disorders with onset usually occurring in childhood and adolescence: Secondary | ICD-10-CM

## 2022-06-09 MED ORDER — LISDEXAMFETAMINE DIMESYLATE 20 MG PO CAPS: ORAL_CAPSULE | ORAL | 0 refills | Status: DC

## 2022-06-09 MED ORDER — LISDEXAMFETAMINE DIMESYLATE 40 MG PO CAPS
40.0000 mg | ORAL_CAPSULE | ORAL | 0 refills | Status: DC
  Filled 2022-06-09: qty 30, 30d supply, fill #0

## 2022-06-09 NOTE — Telephone Encounter (Signed)
Patient comment: [facility] is out of [facility]. Can you send to the pharmacy downstairs? Thanks!   Requesting: Vyvanse 40mg  Contract: No Expired but will get at 06/28/22 visit UDS: 11/23/21 Last Visit: 11/23/2021 Next Visit: 06/28/2022 Last Refill: 06/07/22-- [facility] not [facility]  Please Advise

## 2022-06-10 ENCOUNTER — Other Ambulatory Visit: Payer: Self-pay | Admitting: Family Medicine

## 2022-06-10 ENCOUNTER — Other Ambulatory Visit (HOSPITAL_BASED_OUTPATIENT_CLINIC_OR_DEPARTMENT_OTHER): Payer: Self-pay

## 2022-06-10 DIAGNOSIS — F988 Other specified behavioral and emotional disorders with onset usually occurring in childhood and adolescence: Secondary | ICD-10-CM

## 2022-06-10 MED ORDER — LISDEXAMFETAMINE DIMESYLATE 20 MG PO CAPS
20.0000 mg | ORAL_CAPSULE | Freq: Every day | ORAL | 0 refills | Status: DC
  Filled 2022-06-10: qty 60, 30d supply, fill #0

## 2022-06-10 MED ORDER — LISDEXAMFETAMINE DIMESYLATE 20 MG PO CAPS: ORAL_CAPSULE | ORAL | 0 refills | Status: DC

## 2022-06-28 ENCOUNTER — Ambulatory Visit (INDEPENDENT_AMBULATORY_CARE_PROVIDER_SITE_OTHER): Payer: BC Managed Care – PPO | Admitting: Family Medicine

## 2022-06-28 ENCOUNTER — Encounter: Payer: Self-pay | Admitting: Family Medicine

## 2022-06-28 VITALS — BP 110/80 | HR 75 | Temp 98.5°F | Resp 18 | Ht 64.0 in | Wt 145.6 lb

## 2022-06-28 DIAGNOSIS — Z79899 Other long term (current) drug therapy: Secondary | ICD-10-CM

## 2022-06-28 DIAGNOSIS — F988 Other specified behavioral and emotional disorders with onset usually occurring in childhood and adolescence: Secondary | ICD-10-CM | POA: Diagnosis not present

## 2022-06-28 DIAGNOSIS — Z0000 Encounter for general adult medical examination without abnormal findings: Secondary | ICD-10-CM | POA: Diagnosis not present

## 2022-06-28 LAB — COMPREHENSIVE METABOLIC PANEL
ALT: 11 U/L (ref 0–35)
AST: 17 U/L (ref 0–37)
Albumin: 4.1 g/dL (ref 3.5–5.2)
Alkaline Phosphatase: 47 U/L (ref 39–117)
BUN: 12 mg/dL (ref 6–23)
CO2: 28 mEq/L (ref 19–32)
Calcium: 9 mg/dL (ref 8.4–10.5)
Chloride: 102 mEq/L (ref 96–112)
Creatinine, Ser: 0.69 mg/dL (ref 0.40–1.20)
GFR: 107.06 mL/min (ref >60.00)
Glucose, Bld: 87 mg/dL (ref 70–99)
Potassium: 4.5 mEq/L (ref 3.5–5.1)
Sodium: 137 mEq/L (ref 135–145)
Total Bilirubin: 0.4 mg/dL (ref 0.2–1.2)
Total Protein: 7 g/dL (ref 6.0–8.3)

## 2022-06-28 LAB — CBC WITH DIFFERENTIAL/PLATELET
Basophils Absolute: 0 10*3/uL (ref 0.0–0.1)
Basophils Relative: 0.6 % (ref 0.0–3.0)
Eosinophils Absolute: 0.1 10*3/uL (ref 0.0–0.7)
Eosinophils Relative: 1.1 % (ref 0.0–5.0)
HCT: 38 % (ref 36.0–46.0)
Hemoglobin: 12.5 g/dL (ref 12.0–15.0)
Lymphocytes Relative: 28.6 % (ref 12.0–46.0)
Lymphs Abs: 1.7 10*3/uL (ref 0.7–4.0)
MCHC: 33 g/dL (ref 30.0–36.0)
MCV: 89.4 fl (ref 78.0–100.0)
Monocytes Absolute: 0.5 10*3/uL (ref 0.1–1.0)
Monocytes Relative: 8 % (ref 3.0–12.0)
Neutro Abs: 3.7 10*3/uL (ref 1.4–7.7)
Neutrophils Relative %: 61.7 % (ref 43.0–77.0)
Platelets: 356 10*3/uL (ref 150.0–400.0)
RBC: 4.24 Mil/uL (ref 3.87–5.11)
RDW: 14.1 % (ref 11.5–15.5)
WBC: 6 10*3/uL (ref 4.0–10.5)

## 2022-06-28 LAB — LIPID PANEL
Cholesterol: 192 mg/dL (ref 0–200)
HDL: 87.7 mg/dL (ref >39.00)
LDL Cholesterol: 88 mg/dL (ref 0–99)
NonHDL: 104.5
Total CHOL/HDL Ratio: 2
Triglycerides: 81 mg/dL (ref 0.0–149.0)
VLDL: 16.2 mg/dL (ref 0.0–40.0)

## 2022-06-28 LAB — TSH: TSH: 2.63 u[IU]/mL (ref 0.35–5.50)

## 2022-06-28 NOTE — Assessment & Plan Note (Signed)
Stable Con't vyvanse

## 2022-06-28 NOTE — Progress Notes (Signed)
Subjective:   By signing my name below, I, Christina Villarreal, attest that this documentation has been prepared under the direction and in the presence of Christina Schultz, DO 06/28/22   Patient ID: Christina Villarreal, female    DOB: [birth year], 42 y.o.   MRN: [id number]  Chief Complaint  Patient presents with   Annual Exam    Pt states fasting     HPI Patient is in today for a comprehensive physical exam. She is overall well and has no new concerns.   She has been compliant with her 20 mg Vyvanse, 10 mg Prozac daily, and 500 mg Valtrex daily.   She follows up with Dr. Silvestre [name] for gyn. Her last visit was 06/2021. She is scheduled for routine pap smears every 3-5 years.   She has been following up with [facility].   Last pap: 11/26/2016. Results were normal.   Last mammogram: 05/19/2022. Results were normal.   She has not been regularly exercising.  Past Medical History:  Diagnosis Date   ADD (attention deficit disorder)    GERD (gastroesophageal reflux disease)     History reviewed. No pertinent surgical history.  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   Occupation: licensed clinical social worker  Tobacco Use   Smoking status: Never   Smokeless tobacco: Never  Substance and Sexual Activity   Alcohol use: Yes    Alcohol/week: 3.0 standard drinks of alcohol    Types: 3 Glasses of wine per week   Drug use: No   Sexual activity: Yes    Partners: Male   Other Topics Concern   Not on file  Social History Narrative   Married with 3 children    No exercise    Social Determinants of Health   Financial Resource Strain: Not on file  Food Insecurity: Not on file  Transportation Needs: Not on file  Physical Activity: Not on file  Stress: Not on file  Social Connections: Not on file  Intimate Partner Violence: Not on file    Outpatient Medications Prior to Visit  Medication Sig Dispense Refill   FLUoxetine (PROZAC) 10 MG capsule TAKE 1 CAPSULE BY MOUTH EVERY DAY 90 capsule 1   lisdexamfetamine (VYVANSE) 20 MG capsule Take 1-2 capsules (20-40 mg total) by mouth daily. 60 capsule 0   lisdexamfetamine (VYVANSE) 20 MG capsule 1-2 po qd 60 capsule 0   valACYclovir (VALTREX) 500 MG tablet Take 500 mg by mouth daily.      oseltamivir (TAMIFLU) 75 MG capsule Take 1 capsule (75 mg total) by mouth 2 (two) times daily. (Patient not taking: Reported on 06/28/2022) 10 capsule 0   No facility-administered medications prior to visit.    No Known Allergies  Review of Systems  Constitutional:  Negative for fever and malaise/fatigue.  HENT:  Negative for congestion.   Eyes:  Negative  for blurred vision.  Respiratory:  Negative for shortness of breath.   Cardiovascular:  Negative for chest pain, palpitations and leg swelling.  Gastrointestinal:  Negative for abdominal pain, blood in stool and nausea.  Genitourinary:  Negative for dysuria and frequency.  Musculoskeletal:  Negative for falls.  Skin:  Negative for rash.  Neurological:  Negative for dizziness, loss of consciousness and headaches.  Endo/Heme/Allergies:  Negative for environmental allergies.  Psychiatric/Behavioral:  Negative for depression. The patient is not nervous/anxious.        Objective:    Physical Exam Vitals and nursing note reviewed.  Constitutional:      General: She is not in acute distress.    Appearance: She is well-developed. She is not diaphoretic.  HENT:      Head: Normocephalic and atraumatic.     Right Ear: External ear normal.     Left Ear: External ear normal.     Nose: Nose normal.  Eyes:     General:        Right eye: No discharge.        Left eye: No discharge.     Conjunctiva/sclera: Conjunctivae normal.     Pupils: Pupils are equal, round, and reactive to light.  Neck:     Thyroid: No thyromegaly.     Vascular: No carotid bruit or JVD.  Cardiovascular:     Rate and Rhythm: Normal rate and regular rhythm.     Heart sounds: Normal heart sounds. No murmur heard. Pulmonary:     Effort: Pulmonary effort is normal. No respiratory distress.     Breath sounds: Normal breath sounds. No wheezing or rales.  Chest:     Chest wall: No tenderness.  Abdominal:     General: Bowel sounds are normal. There is no distension.     Palpations: Abdomen is soft. There is no mass.     Tenderness: There is no abdominal tenderness. There is no guarding or rebound.  Musculoskeletal:        General: No tenderness. Normal range of motion.     Cervical back: Normal range of motion and neck supple.  Lymphadenopathy:     Cervical: No cervical adenopathy.  Skin:    General: Skin is warm and dry.     Findings: No erythema or rash.  Neurological:     General: No focal deficit present.     Mental Status: She is alert and oriented to person, place, and time.     Cranial Nerves: No cranial nerve deficit.     Deep Tendon Reflexes: Reflexes are normal and symmetric.  Psychiatric:        Mood and Affect: Mood normal.        Behavior: Behavior normal.        Thought Content: Thought content normal.        Judgment: Judgment normal.     BP 110/80 (BP Location: Left Arm, Patient Position: Sitting, Cuff Size: Normal)   Pulse 75   Temp 98.5 F (36.9 C) (Oral)   Resp 18   Ht 5\' 4"  (1.626 m)   Wt 145 lb 9.6 oz (66 kg)   SpO2 98%   BMI 24.99 kg/m  Wt Readings from Last 3 Encounters:  06/28/22 145 lb 9.6 oz (66 kg)  11/23/21 138 lb 12.8 oz (63 kg)   05/22/21 134 lb (60.8 kg)       Assessment & Plan:  Preventative health care Assessment & Plan: Ghm utd Chek labs  See AVS  Health Maintenance  Topic Date Due   HIV Screening  Never done   COVID-19 Vaccine (4 - 2023-24 season) 10/30/2021   INFLUENZA VACCINE  09/30/2022   PAP SMEAR-Modifier  07/01/2024   DTaP/Tdap/Td (2 - Td or Tdap) 07/15/2027   Hepatitis C Screening  Completed   HPV VACCINES  Aged Out     Orders: -     CBC with Differential/Platelet -     Comprehensive metabolic panel -     Lipid panel -     TSH  High risk medication use -     Drug Monitoring Panel [phone] , Urine  Attention deficit disorder (ADD) without hyperactivity Assessment & Plan: Stable Con't vyvanse   Orders: -     Drug Monitoring Panel [phone] , Urine     I,Rachel Rivera,acting as a scribe for Christina Schultz, DO.,have documented all relevant documentation on the behalf of Christina Schultz, DO,as directed by  Christina Schultz, DO while in the presence of Christina Schultz, DO.   I, Christina Schultz, DO, personally preformed the services described in this documentation.  All medical record entries made by the scribe were at my direction and in my presence.  I have reviewed the chart and discharge instructions (if applicable) and agree that the record reflects my personal performance and is accurate and complete. 06/28/22   Christina Schultz, DO

## 2022-06-28 NOTE — Assessment & Plan Note (Signed)
Ghm utd Chek labs  See AVS Health Maintenance  Topic Date Due   HIV Screening  Never done   COVID-19 Vaccine (4 - 2023-24 season) 10/30/2021   INFLUENZA VACCINE  09/30/2022   PAP SMEAR-Modifier  07/01/2024   DTaP/Tdap/Td (2 - Td or Tdap) 07/15/2027   Hepatitis C Screening  Completed   HPV VACCINES  Aged Out

## 2022-06-29 LAB — DRUG MONITORING PANEL 376104, URINE
Amphetamines: NEGATIVE ng/mL (ref <500)
Barbiturates: NEGATIVE ng/mL (ref <300)
Benzodiazepines: NEGATIVE ng/mL (ref <100)
Cocaine Metabolite: NEGATIVE ng/mL (ref <150)
Desmethyltramadol: NEGATIVE ng/mL (ref <100)
Opiates: NEGATIVE ng/mL (ref <100)
Oxycodone: NEGATIVE ng/mL (ref <100)
Tramadol: NEGATIVE ng/mL (ref <100)

## 2022-06-29 LAB — DM TEMPLATE

## 2022-07-04 ENCOUNTER — Encounter: Payer: Self-pay | Admitting: Family Medicine

## 2022-07-08 DIAGNOSIS — L281 Prurigo nodularis: Secondary | ICD-10-CM | POA: Diagnosis not present

## 2022-07-08 DIAGNOSIS — L814 Other melanin hyperpigmentation: Secondary | ICD-10-CM | POA: Diagnosis not present

## 2022-07-08 DIAGNOSIS — D2371 Other benign neoplasm of skin of right lower limb, including hip: Secondary | ICD-10-CM | POA: Diagnosis not present

## 2022-07-08 DIAGNOSIS — D229 Melanocytic nevi, unspecified: Secondary | ICD-10-CM | POA: Diagnosis not present

## 2022-07-12 ENCOUNTER — Other Ambulatory Visit: Payer: Self-pay | Admitting: Family Medicine

## 2022-07-12 ENCOUNTER — Other Ambulatory Visit (HOSPITAL_BASED_OUTPATIENT_CLINIC_OR_DEPARTMENT_OTHER): Payer: Self-pay

## 2022-07-12 DIAGNOSIS — F988 Other specified behavioral and emotional disorders with onset usually occurring in childhood and adolescence: Secondary | ICD-10-CM

## 2022-07-12 MED ORDER — LISDEXAMFETAMINE DIMESYLATE 20 MG PO CAPS
20.0000 mg | ORAL_CAPSULE | Freq: Every day | ORAL | 0 refills | Status: DC
Start: 2022-07-12 — End: 2022-08-14
  Filled 2022-07-12: qty 60, 30d supply, fill #0

## 2022-07-12 NOTE — Telephone Encounter (Signed)
Requesting: Vyvanse Contract: 06/28/22 UDS: 06/28/22 Last OV: 06/28/22 Next OV: N/A Last Refill: 06/10/22, #60--0 RF Database:   Please advise

## 2022-07-29 ENCOUNTER — Other Ambulatory Visit (HOSPITAL_COMMUNITY): Payer: Self-pay

## 2022-08-14 ENCOUNTER — Other Ambulatory Visit: Payer: Self-pay | Admitting: Family Medicine

## 2022-08-14 DIAGNOSIS — F988 Other specified behavioral and emotional disorders with onset usually occurring in childhood and adolescence: Secondary | ICD-10-CM

## 2022-08-16 NOTE — Telephone Encounter (Signed)
Medication has not been prescribed here, please advise.

## 2022-08-16 NOTE — Telephone Encounter (Signed)
Requesting: Vyvanse 20mg   Contract: 06/28/22 UDS: 06/28/22 Last Visit: 06/28/22 Next Visit: None Last Refill: 07/12/22 #60 and 0RF  Please Advise

## 2022-08-17 ENCOUNTER — Other Ambulatory Visit (HOSPITAL_BASED_OUTPATIENT_CLINIC_OR_DEPARTMENT_OTHER): Payer: Self-pay

## 2022-08-17 MED ORDER — VALACYCLOVIR HCL 500 MG PO TABS
500.0000 mg | ORAL_TABLET | Freq: Every day | ORAL | 1 refills | Status: AC
  Filled 2022-08-17: qty 30, 30d supply, fill #0
  Filled 2022-09-17: qty 30, 30d supply, fill #1
  Filled 2022-10-22: qty 30, 30d supply, fill #2
  Filled 2023-04-04: qty 90, 90d supply, fill #3

## 2022-08-18 ENCOUNTER — Other Ambulatory Visit (HOSPITAL_BASED_OUTPATIENT_CLINIC_OR_DEPARTMENT_OTHER): Payer: Self-pay

## 2022-08-18 MED ORDER — LISDEXAMFETAMINE DIMESYLATE 20 MG PO CAPS
20.0000 mg | ORAL_CAPSULE | Freq: Every day | ORAL | 0 refills | Status: DC
Start: 2022-08-18 — End: 2022-09-17
  Filled 2022-08-18: qty 60, 30d supply, fill #0

## 2022-09-17 ENCOUNTER — Other Ambulatory Visit: Payer: Self-pay

## 2022-09-17 ENCOUNTER — Other Ambulatory Visit (HOSPITAL_BASED_OUTPATIENT_CLINIC_OR_DEPARTMENT_OTHER): Payer: Self-pay

## 2022-09-17 ENCOUNTER — Other Ambulatory Visit: Payer: Self-pay | Admitting: Family Medicine

## 2022-09-17 ENCOUNTER — Encounter: Payer: Self-pay | Admitting: Family Medicine

## 2022-09-17 DIAGNOSIS — F988 Other specified behavioral and emotional disorders with onset usually occurring in childhood and adolescence: Secondary | ICD-10-CM

## 2022-09-17 MED ORDER — FLUOXETINE HCL 10 MG PO CAPS: 10.0000 mg | ORAL_CAPSULE | Freq: Every day | ORAL | 1 refills | Status: DC

## 2022-09-17 MED ORDER — LISDEXAMFETAMINE DIMESYLATE 20 MG PO CAPS
20.0000 mg | ORAL_CAPSULE | Freq: Every day | ORAL | 0 refills | Status: DC
Start: 2022-09-17 — End: 2022-10-22
  Filled 2022-09-17: qty 60, 30d supply, fill #0

## 2022-09-17 NOTE — Telephone Encounter (Signed)
Requesting: Vyvanse Contract: 06/28/22 UDS: 06/28/22 Last OV: 06/28/22 Next OV: 12/14/22 Last Refill: 08/18/22, #60--0 RF Database:   Please advise

## 2022-09-20 ENCOUNTER — Other Ambulatory Visit: Payer: Self-pay

## 2022-09-21 DIAGNOSIS — R21 Rash and other nonspecific skin eruption: Secondary | ICD-10-CM | POA: Diagnosis not present

## 2022-09-21 DIAGNOSIS — L280 Lichen simplex chronicus: Secondary | ICD-10-CM | POA: Diagnosis not present

## 2022-09-22 ENCOUNTER — Other Ambulatory Visit (HOSPITAL_BASED_OUTPATIENT_CLINIC_OR_DEPARTMENT_OTHER): Payer: Self-pay

## 2022-09-22 MED ORDER — FLUOXETINE HCL 10 MG PO CAPS
10.0000 mg | ORAL_CAPSULE | Freq: Every day | ORAL | 1 refills | Status: DC
Start: 2022-09-22 — End: 2023-02-28
  Filled 2022-09-22 – 2022-09-23 (×2): qty 30, 30d supply, fill #0
  Filled 2022-10-22: qty 30, 30d supply, fill #1

## 2022-09-22 NOTE — Addendum Note (Signed)
Addended by: Roxanne Gates on: 09/22/2022 10:02 AM   Modules accepted: Orders

## 2022-09-23 ENCOUNTER — Other Ambulatory Visit: Payer: Self-pay

## 2022-09-23 ENCOUNTER — Other Ambulatory Visit (HOSPITAL_BASED_OUTPATIENT_CLINIC_OR_DEPARTMENT_OTHER): Payer: Self-pay

## 2022-10-04 DIAGNOSIS — L280 Lichen simplex chronicus: Secondary | ICD-10-CM | POA: Diagnosis not present

## 2022-10-22 ENCOUNTER — Other Ambulatory Visit: Payer: Self-pay | Admitting: Family Medicine

## 2022-10-22 ENCOUNTER — Other Ambulatory Visit (HOSPITAL_BASED_OUTPATIENT_CLINIC_OR_DEPARTMENT_OTHER): Payer: Self-pay

## 2022-10-22 DIAGNOSIS — F988 Other specified behavioral and emotional disorders with onset usually occurring in childhood and adolescence: Secondary | ICD-10-CM

## 2022-10-22 MED ORDER — LISDEXAMFETAMINE DIMESYLATE 20 MG PO CAPS
20.0000 mg | ORAL_CAPSULE | Freq: Every day | ORAL | 0 refills | Status: DC
Start: 2022-10-22 — End: 2022-11-21
  Filled 2022-10-22: qty 60, 30d supply, fill #0

## 2022-10-22 NOTE — Telephone Encounter (Signed)
Requesting: Vyvanse 20mg   Contract:06/28/22 UDS: 06/28/22 Last Visit: 06/28/22 Next Visit: 12/14/22 Last Refill: 09/17/22 #60 and 0RF   Please Advise

## 2022-10-25 ENCOUNTER — Other Ambulatory Visit (HOSPITAL_BASED_OUTPATIENT_CLINIC_OR_DEPARTMENT_OTHER): Payer: Self-pay

## 2022-11-05 ENCOUNTER — Other Ambulatory Visit: Payer: Self-pay

## 2022-11-21 ENCOUNTER — Other Ambulatory Visit: Payer: Self-pay | Admitting: Family Medicine

## 2022-11-21 DIAGNOSIS — F988 Other specified behavioral and emotional disorders with onset usually occurring in childhood and adolescence: Secondary | ICD-10-CM

## 2022-11-22 MED ORDER — LISDEXAMFETAMINE DIMESYLATE 20 MG PO CAPS
20.0000 mg | ORAL_CAPSULE | Freq: Every day | ORAL | 0 refills | Status: DC
Start: 2022-11-22 — End: 2023-01-03
  Filled 2022-11-22: qty 60, 30d supply, fill #0

## 2022-11-22 NOTE — Telephone Encounter (Signed)
Requesting: Vyvanse 20mg   Contract: 06/28/22 UDS: 06/28/22 Last Visit: 06/28/22 Next Visit: 12/14/22 Last Refill: 10/22/22 #60 and 0RF   Please Advise

## 2022-11-23 ENCOUNTER — Other Ambulatory Visit (HOSPITAL_BASED_OUTPATIENT_CLINIC_OR_DEPARTMENT_OTHER): Payer: Self-pay

## 2022-11-23 DIAGNOSIS — E0590 Thyrotoxicosis, unspecified without thyrotoxic crisis or storm: Secondary | ICD-10-CM | POA: Diagnosis not present

## 2022-11-23 DIAGNOSIS — R635 Abnormal weight gain: Secondary | ICD-10-CM | POA: Diagnosis not present

## 2022-12-14 ENCOUNTER — Ambulatory Visit: Payer: BC Managed Care – PPO | Admitting: Family Medicine

## 2022-12-27 ENCOUNTER — Ambulatory Visit: Payer: BC Managed Care – PPO | Admitting: Family Medicine

## 2022-12-29 ENCOUNTER — Other Ambulatory Visit: Payer: Self-pay | Admitting: Family

## 2022-12-29 DIAGNOSIS — F988 Other specified behavioral and emotional disorders with onset usually occurring in childhood and adolescence: Secondary | ICD-10-CM

## 2023-01-03 ENCOUNTER — Ambulatory Visit: Payer: BC Managed Care – PPO | Admitting: Family Medicine

## 2023-01-03 ENCOUNTER — Encounter: Payer: Self-pay | Admitting: Family Medicine

## 2023-01-03 ENCOUNTER — Other Ambulatory Visit (HOSPITAL_BASED_OUTPATIENT_CLINIC_OR_DEPARTMENT_OTHER): Payer: Self-pay

## 2023-01-03 DIAGNOSIS — F988 Other specified behavioral and emotional disorders with onset usually occurring in childhood and adolescence: Secondary | ICD-10-CM | POA: Diagnosis not present

## 2023-01-03 MED ORDER — LISDEXAMFETAMINE DIMESYLATE 20 MG PO CAPS
20.0000 mg | ORAL_CAPSULE | Freq: Every day | ORAL | 0 refills | Status: AC
Start: 2023-03-05 — End: ?
  Filled 2023-04-04: qty 60, 30d supply, fill #0

## 2023-01-03 MED ORDER — LISDEXAMFETAMINE DIMESYLATE 20 MG PO CAPS
20.0000 mg | ORAL_CAPSULE | Freq: Every day | ORAL | 0 refills | Status: DC
Start: 2023-02-02 — End: 2023-02-28
  Filled 2023-02-02: qty 60, 30d supply, fill #0

## 2023-01-03 MED ORDER — LISDEXAMFETAMINE DIMESYLATE 20 MG PO CAPS
20.0000 mg | ORAL_CAPSULE | Freq: Every day | ORAL | 0 refills | Status: AC
Start: 2023-01-03 — End: ?
  Filled 2023-01-03: qty 60, 30d supply, fill #0

## 2023-01-03 NOTE — Patient Instructions (Signed)
Living With Attention Deficit Hyperactivity Disorder If you have been diagnosed with attention deficit hyperactivity disorder (ADHD), you may be relieved that you now know why you have felt or behaved a certain way. Still, you may feel overwhelmed about the treatment ahead. You may also wonder how to get the support you need and how to deal with the condition day-to-day. With treatment and support, you can live with ADHD and manage your symptoms. How to manage lifestyle changes Managing lifestyle changes can be challenging. Seeking support from your healthcare provider, therapist, family, and friends can be helpful. How to recognize changes in your condition The following signs may mean that your treatment is working well and your condition is improving: Consistently being on time for appointments. Being more organized at home and work. Other people noticing improvements in your behavior. Achieving goals that you set for yourself. Thinking more clearly. The following signs may mean that your treatment is not working very well: Feeling impatience or more confusion. Missing, forgetting, or being late for appointments. An increasing sense of disorganization and messiness. More difficulty in reaching goals that you set for yourself. Loved ones becoming angry or frustrated with you. Follow these instructions at home: Medicines Take over-the-counter and prescription medicines only as told by your health care provider. Check with your health care provider before taking any new medicines. General instructions Create structure and an organized atmosphere at home. For example: Make a list of tasks, then rank them from most important to least important. Work on one task at a time until your listed tasks are done. Make a daily schedule and follow it consistently every day. Use an appointment calendar, and check it 2-3 times a day to keep on track. Keep it with you when you leave the house. Create  spaces where you keep certain things, and always put things back in their places after you use them. Keep all follow-up visits. Your health care provider will need to monitor your condition and adjust your treatment over time. Where to find support Talking to others  Keep emotion out of important discussions and speak in a calm, logical way. Listen closely and patiently to your loved ones. Try to understand their point of view, and try to avoid getting defensive. Take responsibility for the consequences of your actions. Ask that others do not take your behaviors personally. Aim to solve problems as they come up, and express your feelings instead of bottling them up. Talk openly about what you need from your loved ones and how they can support you. Consider going to family therapy sessions or having your family meet with a specialist who deals with ADHD-related behavior problems. Finances Not all insurance plans cover mental health care, so it is important to check with your insurance carrier. If paying for co-pays or counseling services is a problem, search for a local or county mental health care center. Public mental health care services may be offered there at a low cost or no cost when you are not able to see a private health care provider. If you are taking medicine for ADHD, you may be able to get the generic form, which may be less expensive than brand-name medicine. Some makers of prescription medicines also offer help to patients who cannot afford the medicines that they need. Therapy and support groups Talking with a mental health care provider and participating in support groups can help to improve your quality of life, daily functioning, and overall symptoms. Questions to ask your health   care provider: What are the risks and benefits of taking medicines? Would I benefit from therapy? How often should I follow up with a health care provider? Where to find more information Learn more  about ADHD from: Children and Adults with Attention Deficit Hyperactivity Disorder: chadd.org National Institute of Mental Health: nimh.nih.gov Centers for Disease Control and Prevention: cdc.gov Contact a health care provider if: You have side effects from your medicines, such as: Repeated muscle twitches, coughing, or speech outbursts. Sleep problems. Loss of appetite. Dizziness. Unusually fast heartbeat. Stomach pains. Headaches. You have new or worsening behavior problems. You are struggling with anxiety, depression, or substance abuse. Get help right away if: You have a severe reaction to a medicine. These symptoms may be an emergency. Get help right away. Call 911. Do not wait to see if the symptoms will go away. Do not drive yourself to the hospital. Take one of these steps if you feel like you may hurt yourself or others, or have thoughts about taking your own life: Go to your nearest emergency room. Call 911. Call the National Suicide Prevention Lifeline at 1-800-273-8255 or 988. This is open 24 hours a day. Text the Crisis Text Line at 741741. Summary With treatment and support, you can live with ADHD and manage your symptoms. Consider taking part in family therapy or self-help groups with family members or friends. When you talk with friends and family about your ADHD, be patient and communicate openly. Keep all follow-up visits. Your health care provider will need to monitor your condition and adjust your treatment over time. This information is not intended to replace advice given to you by your health care provider. Make sure you discuss any questions you have with your health care provider. Document Revised: 06/05/2021 Document Reviewed: 06/05/2021 Elsevier Patient Education  2024 Elsevier Inc.  

## 2023-01-04 NOTE — Progress Notes (Signed)
Established Patient Office Visit  Subjective   Patient ID: Christina Villarreal, female    DOB: Jul 29, 1980  Age: 42 y.o. MRN: [id number]  Chief Complaint  Patient presents with   ADD   Follow-up    HPI Discussed the use of AI scribe software for clinical note transcription with the patient, who gave verbal consent to proceed.  History of Present Illness   The patient, with ADD, presents for a routine follow-up. She is currently on a 20mg  medication, taken twice daily. The patient adjusts the dosage based on her daily activities and overall busyness. She reports that the medication is working well for her. The patient has insurance through [employer] [place].      Patient Active Problem List   Diagnosis Date Noted   Preventative health care 05/22/2021   PMDD (premenstrual dysphoric disorder) 11/14/2020   Postpartum depression 12/20/2017   Acute pharyngitis 07/30/2016   General medical examination 04/20/2011   Sinusitis 03/22/2011   GERD 12/29/2007   Attention deficit disorder 07/15/2006   Past Medical History:  Diagnosis Date   ADD (attention deficit disorder)    GERD (gastroesophageal reflux disease)    No past surgical history on file. 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   Occupation: licensed clinical social worker  Tobacco Use   Smoking status: Never   Smokeless tobacco: Never  Substance and Sexual Activity   Alcohol use: Yes    Alcohol/week: 3.0 standard drinks of alcohol    Types: 3 Glasses of wine per week   Drug use:  No   Sexual activity: Yes    Partners: Male  Other Topics Concern   Not on file  Social History Narrative   Married with 3 children    No exercise    Social Determinants of Health   Financial Resource Strain: Not on file  Food Insecurity: Low Risk  (11/23/2022)   Received from [facility]   Hunger Vital Sign    Worried About Running Out of Food in the Last Year: Never true    Ran Out of Food in the Last Year: Never true  Transportation Needs: No Transportation Needs (11/23/2022)   Received from [facility]    In the past 12 months, has lack of reliable transportation kept you from medical appointments, meetings, work or from getting things needed for daily living? : No  Physical Activity: Not on file  Stress: Not on file  Social Connections: Not on file  Intimate Partner Violence: Not on file   Family Status  Relation Name Status   Mother  Alive   Father  Alive   MGM  Deceased at age 80s   MGF  Deceased at age 55   PGM  Deceased at age 19   Brother  Alive   PGF  Deceased   Brother  Alive  No partnership data on file   Family History  Problem Relation Age of Onset   Crohn's disease Mother    Hyperlipidemia Mother    Alzheimer's disease Maternal Grandmother    Heart attack Maternal Grandfather    Breast cancer Paternal Grandmother    Lung cancer Paternal Grandfather        smoker   COPD Paternal Grandfather    No Known Allergies    Review of Systems  Constitutional:  Negative for fever and malaise/fatigue.  HENT:  Negative for congestion.   Eyes:  Negative for blurred vision.  Respiratory:  Negative for shortness of breath.   Cardiovascular:  Negative for chest pain, palpitations and leg swelling.  Gastrointestinal:  Negative for abdominal pain, blood in stool and nausea.  Genitourinary:  Negative for dysuria and frequency.  Musculoskeletal:  Negative for falls.  Skin:  Negative for rash.  Neurological:  Negative for dizziness, loss of  consciousness and headaches.  Endo/Heme/Allergies:  Negative for environmental allergies.  Psychiatric/Behavioral:  Negative for depression. The patient is not nervous/anxious.       Objective:     BP 104/80 (BP Location: Left Arm, Patient Position: Sitting, Cuff Size: Normal)   Pulse 98   Temp 98.1 F (36.7 C) (Oral)   Resp 18   Ht 5\' 4"  (1.626 m)   Wt 148 lb 9.6 oz (67.4 kg)   SpO2 99%   BMI 25.51 kg/m  BP Readings from Last 3 Encounters:  01/03/23 104/80  06/28/22 110/80  11/23/21 120/80   Wt Readings from Last 3 Encounters:  01/03/23 148 lb 9.6 oz (67.4 kg)  06/28/22 145 lb 9.6 oz (66 kg)  11/23/21 138 lb 12.8 oz (63 kg)   SpO2 Readings from Last 3 Encounters:  01/03/23 99%  06/28/22 98%  11/23/21 99%      Physical Exam Vitals and nursing note reviewed.  Constitutional:      General: She is not in acute distress.    Appearance: Normal appearance. She is well-developed.  HENT:     Head: Normocephalic and atraumatic.  Eyes:     General: No scleral icterus.       Right eye: No discharge.        Left eye: No discharge.  Cardiovascular:     Rate and Rhythm: Normal rate and regular rhythm.     Heart sounds: No murmur heard. Pulmonary:     Effort: Pulmonary effort is normal. No respiratory distress.     Breath sounds: Normal breath sounds.  Musculoskeletal:        General: Normal range of motion.     Cervical back: Normal range of motion and neck supple.     Right lower leg: No edema.     Left lower leg: No edema.  Skin:    General: Skin is warm and dry.  Neurological:     Mental Status: She is alert and oriented to person, place, and time.  Psychiatric:        Mood and Affect: Mood normal.        Behavior: Behavior normal.        Thought Content: Thought content normal.        Judgment: Judgment normal.      No results found for any visits on 01/03/23.  Last CBC Lab Results  Component Value Date   WBC 6.0 06/28/2022   HGB 12.5 06/28/2022    HCT 38.0 06/28/2022   MCV 89.4 06/28/2022   MCH 28.9 01/08/2020   RDW 14.1 06/28/2022   PLT 356.0 06/28/2022  Last metabolic panel Lab Results  Component Value Date   GLUCOSE 87 06/28/2022   NA 137 06/28/2022   K 4.5 06/28/2022   CL 102 06/28/2022   CO2 28 06/28/2022   BUN 12 06/28/2022   CREATININE 0.69 06/28/2022   GFR 107.06 06/28/2022   CALCIUM 9.0 06/28/2022   PROT 7.0 06/28/2022   ALBUMIN 4.1 06/28/2022   BILITOT 0.4 06/28/2022   ALKPHOS 47 06/28/2022   AST 17 06/28/2022   ALT 11 06/28/2022   ANIONGAP 10 01/08/2020   Last lipids Lab Results  Component Value Date   CHOL 192 06/28/2022   HDL 87.70 06/28/2022   LDLCALC 88 06/28/2022   TRIG 81.0 06/28/2022   CHOLHDL 2 06/28/2022   Last hemoglobin A1c No results found for: "HGBA1C" Last thyroid functions Lab Results  Component Value Date   TSH 2.63 06/28/2022   Last vitamin D Lab Results  Component Value Date   VD25OH 42.97 04/26/2016   Last vitamin B12 and Folate Lab Results  Component Value Date   VITAMINB12 683 04/26/2016      The 10-year ASCVD risk score (Arnett DK, et al., 2019) is: 0.2%    Assessment & Plan:   Problem List Items Addressed This Visit   None Visit Diagnoses     Attention deficit disorder (ADD) without hyperactivity       Relevant Medications   lisdexamfetamine (VYVANSE) 20 MG capsule (Start on 03/05/2023)   lisdexamfetamine (VYVANSE) 20 MG capsule (Start on 02/02/2023)   lisdexamfetamine (VYVANSE) 20 MG capsule     Assessment and Plan    ADHD Stable on Vyvanse 20mg  twice daily. Patient has the flexibility to adjust the dose based on daily needs. -Continue Vyvanse 20mg  twice daily. -Refill prescription for November, December, and January.        Return in about 6 months (around 07/03/2023), or if symptoms worsen or fail to improve, for add.    [name], DO

## 2023-02-02 ENCOUNTER — Other Ambulatory Visit (HOSPITAL_BASED_OUTPATIENT_CLINIC_OR_DEPARTMENT_OTHER): Payer: Self-pay

## 2023-02-28 ENCOUNTER — Other Ambulatory Visit: Payer: Self-pay | Admitting: Family Medicine

## 2023-02-28 ENCOUNTER — Encounter: Payer: Self-pay | Admitting: Family Medicine

## 2023-02-28 ENCOUNTER — Other Ambulatory Visit (HOSPITAL_BASED_OUTPATIENT_CLINIC_OR_DEPARTMENT_OTHER): Payer: Self-pay

## 2023-02-28 ENCOUNTER — Other Ambulatory Visit (HOSPITAL_COMMUNITY): Payer: Self-pay

## 2023-02-28 DIAGNOSIS — F988 Other specified behavioral and emotional disorders with onset usually occurring in childhood and adolescence: Secondary | ICD-10-CM

## 2023-02-28 MED ORDER — ESCITALOPRAM OXALATE 10 MG PO TABS
10.0000 mg | ORAL_TABLET | Freq: Every day | ORAL | 1 refills | Status: DC
  Filled 2023-02-28: qty 30, 30d supply, fill #0
  Filled 2023-03-08: qty 90, 90d supply, fill #0
  Filled 2023-06-05: qty 90, 90d supply, fill #1

## 2023-02-28 MED ORDER — LISDEXAMFETAMINE DIMESYLATE 20 MG PO CAPS
20.0000 mg | ORAL_CAPSULE | Freq: Every day | ORAL | 0 refills | Status: DC
  Filled 2023-02-28 – 2023-03-04 (×2): qty 60, 30d supply, fill #0

## 2023-03-04 ENCOUNTER — Other Ambulatory Visit (HOSPITAL_BASED_OUTPATIENT_CLINIC_OR_DEPARTMENT_OTHER): Payer: Self-pay

## 2023-03-08 ENCOUNTER — Other Ambulatory Visit (HOSPITAL_BASED_OUTPATIENT_CLINIC_OR_DEPARTMENT_OTHER): Payer: Self-pay

## 2023-03-16 ENCOUNTER — Other Ambulatory Visit (HOSPITAL_COMMUNITY): Payer: Self-pay

## 2023-04-02 ENCOUNTER — Telehealth: Payer: BC Managed Care – PPO | Admitting: Family Medicine

## 2023-04-02 DIAGNOSIS — H6690 Otitis media, unspecified, unspecified ear: Secondary | ICD-10-CM

## 2023-04-02 MED ORDER — AMOXICILLIN 875 MG PO TABS: 875.0000 mg | ORAL_TABLET | Freq: Two times a day (BID) | ORAL | 0 refills | Status: AC

## 2023-04-02 NOTE — Progress Notes (Signed)
 E-Visit for Ear Pain - Acute Otitis Media   We are sorry that you are not feeling well. Here is how we plan to help!  Based on what you have shared with me it looks like you have Acute Otitis Media.  Acute Otitis Media is an infection of the middle or "inner" ear. This type of infection can cause redness, inflammation, and fluid buildup behind the tympanic membrane (ear drum).  The usual symptoms include: Earache/Pain Fever Upper respiratory symptoms Lack of energy/Fatigue/Malaise Slight hearing loss gradually worsening- if the inner ear fills with fluid What causes middle ear infections? Most middle ear infections occur when an infection such as a cold, leads to a build-up of mucus in the middle ear and causes the Eustachian tube (a thin tube that runs from the middle ear to the back of the nose) to become swollen or blocked.   This means mucus can't drain away properly, making it easier for an infection to spread into the middle ear.  How middle ear infections are treated: Most ear infections clear up within three to five days and don't need any specific treatment. If necessary, tylenol or ibuprofen should be used to relieve pain and a high temperature.  If you develop a fever higher than 102, or any significantly worsening symptoms, this could indicate a more serious infection moving to the middle/inner and needs face to face evaluation in an office by a provider.   Antibiotics aren't routinely used to treat middle ear infections, although they may occasionally be prescribed if symptoms persist or are particularly severe. Given your presentation,   I have prescribed Amoxicillin 875 mg one tablet twice daily for 10 days     Your symptoms should improve over the next 3 days and should resolve in about 7 days. Be sure to complete ALL of the prescription(s) given.  HOME CARE: Wash your hands frequently. If you are prescribed an ear drop, do not place the tip of the bottle on your ear or  touch it with your fingers. You can take Acetaminophen 650 mg every 4-6 hours as needed for pain.  If pain is severe or moderate, you can apply a heating pad (set on low) or hot water bottle (wrapped in a towel) to outer ear for 20 minutes.  This will also increase drainage.  GET HELP RIGHT AWAY IF: Fever is over 102.2 degrees. You develop progressive ear pain or hearing loss. Ear symptoms persist longer than 3 days after treatment.  MAKE SURE YOU: Understand these instructions. Will watch your condition. Will get help right away if you are not doing well or get worse.  Thank you for choosing an e-visit.  Your e-visit answers were reviewed by a board certified advanced clinical practitioner to complete your personal care plan. Depending upon the condition, your plan could have included both over the counter or prescription medications.  Please review your pharmacy choice. Make sure the pharmacy is open so you can pick up the prescription now. If there is a problem, you may contact your provider through [employer] and have the prescription routed to another pharmacy.  Your safety is important to [place]. If you have drug allergies check your prescription carefully.   For the next 24 hours you can use MyChart to ask questions about today's visit, request a non-urgent call back, or ask for a work or school excuse. You will get an email with a survey after your eVisit asking about your experience. We would appreciate your feedback.  I hope that your e-visit has been valuable and will aid in your recovery.   have provided 5 minutes of non face to face time during this encounter for chart review and documentation.

## 2023-04-04 ENCOUNTER — Other Ambulatory Visit (HOSPITAL_BASED_OUTPATIENT_CLINIC_OR_DEPARTMENT_OTHER): Payer: Self-pay

## 2023-04-23 ENCOUNTER — Encounter: Payer: Self-pay | Admitting: Family Medicine

## 2023-04-25 ENCOUNTER — Other Ambulatory Visit: Payer: Self-pay | Admitting: Family Medicine

## 2023-04-25 DIAGNOSIS — Z20828 Contact with and (suspected) exposure to other viral communicable diseases: Secondary | ICD-10-CM

## 2023-04-25 MED ORDER — OSELTAMIVIR PHOSPHATE 75 MG PO CAPS: 75.0000 mg | ORAL_CAPSULE | Freq: Every day | ORAL | 0 refills | Status: AC

## 2023-05-03 ENCOUNTER — Other Ambulatory Visit: Payer: Self-pay | Admitting: Family Medicine

## 2023-05-03 DIAGNOSIS — F988 Other specified behavioral and emotional disorders with onset usually occurring in childhood and adolescence: Secondary | ICD-10-CM

## 2023-05-04 ENCOUNTER — Other Ambulatory Visit (HOSPITAL_BASED_OUTPATIENT_CLINIC_OR_DEPARTMENT_OTHER): Payer: Self-pay

## 2023-05-04 MED ORDER — LISDEXAMFETAMINE DIMESYLATE 20 MG PO CAPS
20.0000 mg | ORAL_CAPSULE | Freq: Every day | ORAL | 0 refills | Status: DC
  Filled 2023-05-04 – 2023-05-09 (×2): qty 60, 30d supply, fill #0

## 2023-05-04 NOTE — Telephone Encounter (Signed)
 Requesting: Vyvanse Contract: 06/28/22 UDS: 06/28/22 Last OV: 01/03/23 Next OV: 07/04/23  Last Refill: 03/05/23, #60--0 RF Database:   Please advise

## 2023-05-09 ENCOUNTER — Other Ambulatory Visit (HOSPITAL_BASED_OUTPATIENT_CLINIC_OR_DEPARTMENT_OTHER): Payer: Self-pay

## 2023-05-10 ENCOUNTER — Other Ambulatory Visit (HOSPITAL_BASED_OUTPATIENT_CLINIC_OR_DEPARTMENT_OTHER): Payer: Self-pay

## 2023-05-14 ENCOUNTER — Other Ambulatory Visit: Payer: Self-pay | Admitting: Family Medicine

## 2023-05-23 DIAGNOSIS — Z1231 Encounter for screening mammogram for malignant neoplasm of breast: Secondary | ICD-10-CM | POA: Diagnosis not present

## 2023-05-23 LAB — HM MAMMOGRAPHY

## 2023-05-24 ENCOUNTER — Encounter: Payer: Self-pay | Admitting: Family Medicine

## 2023-06-05 ENCOUNTER — Other Ambulatory Visit: Payer: Self-pay | Admitting: Family Medicine

## 2023-06-05 DIAGNOSIS — F988 Other specified behavioral and emotional disorders with onset usually occurring in childhood and adolescence: Secondary | ICD-10-CM

## 2023-06-06 ENCOUNTER — Other Ambulatory Visit: Payer: Self-pay

## 2023-06-06 NOTE — Telephone Encounter (Signed)
 Requesting: Vyvanse Contract: 06/28/22 UDS: 06/28/22 Last OV: 01/03/2023 Next OV: 07/04/2023 Last Refill: 05/04/2023, #60--0 RF Database:   Please advise

## 2023-06-08 ENCOUNTER — Other Ambulatory Visit (HOSPITAL_BASED_OUTPATIENT_CLINIC_OR_DEPARTMENT_OTHER): Payer: Self-pay

## 2023-06-08 MED ORDER — LISDEXAMFETAMINE DIMESYLATE 20 MG PO CAPS
20.0000 mg | ORAL_CAPSULE | Freq: Every day | ORAL | 0 refills | Status: DC
  Filled 2023-06-08: qty 60, 30d supply, fill #0

## 2023-07-04 ENCOUNTER — Encounter: Payer: BC Managed Care – PPO | Admitting: Family Medicine

## 2023-07-11 ENCOUNTER — Other Ambulatory Visit: Payer: Self-pay | Admitting: Family Medicine

## 2023-07-11 ENCOUNTER — Other Ambulatory Visit (HOSPITAL_BASED_OUTPATIENT_CLINIC_OR_DEPARTMENT_OTHER): Payer: Self-pay

## 2023-07-11 ENCOUNTER — Other Ambulatory Visit: Payer: Self-pay

## 2023-07-11 DIAGNOSIS — F988 Other specified behavioral and emotional disorders with onset usually occurring in childhood and adolescence: Secondary | ICD-10-CM

## 2023-07-11 MED ORDER — LISDEXAMFETAMINE DIMESYLATE 20 MG PO CAPS
20.0000 mg | ORAL_CAPSULE | Freq: Every day | ORAL | 0 refills | Status: DC
  Filled 2023-07-11: qty 60, 30d supply, fill #0

## 2023-07-11 NOTE — Telephone Encounter (Signed)
 Requesting: Vyvanse  Contract: 05/2022 UDS: 05/2022 Last OV: 01/03/2023 Next OV: 08/01/2023 Last Refill: 06/08/2023, #60--0 RF Database:   Please advise

## 2023-07-18 DIAGNOSIS — D2262 Melanocytic nevi of left upper limb, including shoulder: Secondary | ICD-10-CM | POA: Diagnosis not present

## 2023-07-18 DIAGNOSIS — D2261 Melanocytic nevi of right upper limb, including shoulder: Secondary | ICD-10-CM | POA: Diagnosis not present

## 2023-07-18 DIAGNOSIS — D225 Melanocytic nevi of trunk: Secondary | ICD-10-CM | POA: Diagnosis not present

## 2023-07-18 DIAGNOSIS — D224 Melanocytic nevi of scalp and neck: Secondary | ICD-10-CM | POA: Diagnosis not present

## 2023-08-01 ENCOUNTER — Encounter: Admitting: Family Medicine

## 2023-08-13 ENCOUNTER — Other Ambulatory Visit: Payer: Self-pay | Admitting: Family Medicine

## 2023-08-13 DIAGNOSIS — F988 Other specified behavioral and emotional disorders with onset usually occurring in childhood and adolescence: Secondary | ICD-10-CM

## 2023-08-15 ENCOUNTER — Other Ambulatory Visit (HOSPITAL_BASED_OUTPATIENT_CLINIC_OR_DEPARTMENT_OTHER): Payer: Self-pay

## 2023-08-15 MED ORDER — LISDEXAMFETAMINE DIMESYLATE 20 MG PO CAPS
20.0000 mg | ORAL_CAPSULE | Freq: Every day | ORAL | 0 refills | Status: DC
  Filled 2023-08-15: qty 60, 30d supply, fill #0

## 2023-08-15 NOTE — Telephone Encounter (Signed)
 Requesting: Vyvanse  20mg   Contract: 06/28/22 UDS: 06/28/22 Last Visit: 01/03/23 Next Visit: 08/22/23 Last Refill: 07/11/23 #60 and 0RF   Please Advise

## 2023-08-22 ENCOUNTER — Encounter: Admitting: Family Medicine

## 2023-09-03 ENCOUNTER — Other Ambulatory Visit: Payer: Self-pay | Admitting: Family Medicine

## 2023-09-05 ENCOUNTER — Other Ambulatory Visit: Payer: Self-pay

## 2023-09-05 ENCOUNTER — Other Ambulatory Visit (HOSPITAL_COMMUNITY): Payer: Self-pay

## 2023-09-05 ENCOUNTER — Encounter: Admitting: Family Medicine

## 2023-09-05 MED ORDER — ESCITALOPRAM OXALATE 10 MG PO TABS
10.0000 mg | ORAL_TABLET | Freq: Every day | ORAL | 1 refills | Status: DC
  Filled 2023-09-05: qty 90, 90d supply, fill #0
  Filled 2023-12-05: qty 90, 90d supply, fill #1

## 2023-09-26 ENCOUNTER — Encounter: Admitting: Family Medicine

## 2023-10-06 ENCOUNTER — Other Ambulatory Visit: Payer: Self-pay | Admitting: Family Medicine

## 2023-10-06 ENCOUNTER — Other Ambulatory Visit (HOSPITAL_BASED_OUTPATIENT_CLINIC_OR_DEPARTMENT_OTHER): Payer: Self-pay

## 2023-10-06 DIAGNOSIS — F988 Other specified behavioral and emotional disorders with onset usually occurring in childhood and adolescence: Secondary | ICD-10-CM

## 2023-10-06 MED ORDER — LISDEXAMFETAMINE DIMESYLATE 20 MG PO CAPS
20.0000 mg | ORAL_CAPSULE | Freq: Every day | ORAL | 0 refills | Status: DC
  Filled 2023-10-06: qty 60, 30d supply, fill #0

## 2023-10-06 NOTE — Telephone Encounter (Signed)
 Requesting: Vyvanse   Contract: 06/28/22 UDS: 06/28/22 Last OV: 01/03/23 Next OV: 10/11/23 Last Refill: 08/15/2023, #60--0 RF Database:   Please advise

## 2023-10-11 ENCOUNTER — Encounter: Admitting: Family Medicine

## 2023-11-06 ENCOUNTER — Other Ambulatory Visit: Payer: Self-pay | Admitting: Family Medicine

## 2023-11-06 DIAGNOSIS — F988 Other specified behavioral and emotional disorders with onset usually occurring in childhood and adolescence: Secondary | ICD-10-CM

## 2023-11-07 ENCOUNTER — Other Ambulatory Visit (HOSPITAL_BASED_OUTPATIENT_CLINIC_OR_DEPARTMENT_OTHER): Payer: Self-pay

## 2023-11-07 ENCOUNTER — Other Ambulatory Visit: Payer: Self-pay

## 2023-11-07 MED ORDER — LISDEXAMFETAMINE DIMESYLATE 20 MG PO CAPS
20.0000 mg | ORAL_CAPSULE | Freq: Every day | ORAL | 0 refills | Status: DC
  Filled 2023-11-07: qty 60, 30d supply, fill #0

## 2023-11-07 NOTE — Telephone Encounter (Signed)
 Requesting: Vyvanse  20mg   Contract: 06/28/22 UDS:06/28/22 Last Visit: 01/03/23 Next Visit: 11/25/23 Last Refill: 10/06/23 #60 and 0RF   Please Advise

## 2023-11-23 DIAGNOSIS — Z7689 Persons encountering health services in other specified circumstances: Secondary | ICD-10-CM | POA: Diagnosis not present

## 2023-11-23 DIAGNOSIS — E0590 Thyrotoxicosis, unspecified without thyrotoxic crisis or storm: Secondary | ICD-10-CM | POA: Diagnosis not present

## 2023-11-25 ENCOUNTER — Encounter: Admitting: Family Medicine

## 2023-12-05 ENCOUNTER — Other Ambulatory Visit: Payer: Self-pay

## 2023-12-05 ENCOUNTER — Other Ambulatory Visit: Payer: Self-pay | Admitting: Family Medicine

## 2023-12-05 DIAGNOSIS — F988 Other specified behavioral and emotional disorders with onset usually occurring in childhood and adolescence: Secondary | ICD-10-CM

## 2023-12-05 MED ORDER — LISDEXAMFETAMINE DIMESYLATE 20 MG PO CAPS
20.0000 mg | ORAL_CAPSULE | Freq: Every day | ORAL | 0 refills | Status: DC
  Filled 2023-12-05: qty 60, 30d supply, fill #0

## 2023-12-05 NOTE — Telephone Encounter (Signed)
 Requesting: Vyvanse  20mg   Contract: 06/28/22 UDS: 06/28/22 Last Visit: 01/03/23 Next Visit: 01/02/24 Last Refill: 11/07/23 #60 and 0RF   Please Advise

## 2023-12-06 ENCOUNTER — Other Ambulatory Visit (HOSPITAL_BASED_OUTPATIENT_CLINIC_OR_DEPARTMENT_OTHER): Payer: Self-pay

## 2023-12-27 ENCOUNTER — Other Ambulatory Visit: Payer: Self-pay | Admitting: Medical Genetics

## 2024-01-02 ENCOUNTER — Encounter: Admitting: Family Medicine

## 2024-01-09 ENCOUNTER — Other Ambulatory Visit (HOSPITAL_BASED_OUTPATIENT_CLINIC_OR_DEPARTMENT_OTHER): Payer: Self-pay

## 2024-01-09 ENCOUNTER — Other Ambulatory Visit: Payer: Self-pay | Admitting: Family Medicine

## 2024-01-09 DIAGNOSIS — F988 Other specified behavioral and emotional disorders with onset usually occurring in childhood and adolescence: Secondary | ICD-10-CM

## 2024-01-09 MED ORDER — LISDEXAMFETAMINE DIMESYLATE 20 MG PO CAPS
20.0000 mg | ORAL_CAPSULE | Freq: Every day | ORAL | 0 refills | Status: DC
  Filled 2024-01-09: qty 60, 30d supply, fill #0

## 2024-01-09 NOTE — Telephone Encounter (Signed)
 Requesting: Vyvanse  20mg   Contract: 06/28/22 UDS: 06/28/22 Last Visit: 01/03/23 Next Visit: 01/23/24 Last Refill: 12/05/23 #60 and 0RF   Please Advise

## 2024-01-16 ENCOUNTER — Other Ambulatory Visit (HOSPITAL_BASED_OUTPATIENT_CLINIC_OR_DEPARTMENT_OTHER): Payer: Self-pay

## 2024-01-16 ENCOUNTER — Telehealth: Admitting: Emergency Medicine

## 2024-01-16 DIAGNOSIS — R3989 Other symptoms and signs involving the genitourinary system: Secondary | ICD-10-CM | POA: Diagnosis not present

## 2024-01-16 MED ORDER — NITROFURANTOIN MONOHYD MACRO 100 MG PO CAPS
100.0000 mg | ORAL_CAPSULE | Freq: Two times a day (BID) | ORAL | 0 refills | Status: AC
  Filled 2024-01-16: qty 10, 5d supply, fill #0

## 2024-01-16 NOTE — Progress Notes (Signed)

## 2024-01-20 ENCOUNTER — Ambulatory Visit
Admission: RE | Admit: 2024-01-20 | Discharge: 2024-01-20 | Disposition: A | Discharge status: Home / Self Care | Source: Ambulatory Visit | Attending: Internal Medicine | Admitting: Internal Medicine

## 2024-01-20 VITALS — BP 131/79 | HR 86 | Temp 97.9°F | Resp 12 | Ht 64.0 in | Wt 130.0 lb

## 2024-01-20 DIAGNOSIS — R1024 Suprapubic pain: Secondary | ICD-10-CM | POA: Diagnosis not present

## 2024-01-20 DIAGNOSIS — R350 Frequency of micturition: Secondary | ICD-10-CM | POA: Diagnosis not present

## 2024-01-20 LAB — POCT URINE DIPSTICK
Bilirubin, UA: NEGATIVE
Glucose, UA: NEGATIVE mg/dL
Leukocytes, UA: NEGATIVE
Nitrite, UA: NEGATIVE
POC PROTEIN,UA: NEGATIVE
Spec Grav, UA: >=1.03 — AB (ref 1.010–1.025)
Urobilinogen, UA: 0.2 U/dL
pH, UA: 5.5 (ref 5.0–8.0)

## 2024-01-20 LAB — POCT URINE PREGNANCY: Preg Test, Ur: NEGATIVE

## 2024-01-20 NOTE — ED Provider Notes (Signed)
 [facility]    CSN: [id number] Arrival date & time: 01/20/24  1040      History   Chief Complaint Chief Complaint  Patient presents with   Urinary Frequency    Evisit for suspected UTI on 11/17 and was prescribed antibiotics. I felt better after the first day, but I am still experiencing discomfort, pressure, and urgency. I don't think the medication is working. I have had pressure/pain in left ear too. - Entered by patient    HPI Christina Villarreal is a 43 y.o. female.   Christina Villarreal is a 43 y.o. female presenting for chief complaint of urinary frequency, urinary urgency, and pelvic/suprapubic pressure that started on Sunday, January 15, 2024 (6 days ago).  She did an e-visit where she was prescribed Macrobid  for likely urinary tract infection due to history of similar symptoms with UTI.  She has taken Macrobid  as prescribed for the last 5 days and experienced some relief of bladder pressure/urinary symptoms after 24 to 48 hours with the medication but is now experiencing worsening symptoms again.  She has a difficult time fully emptying her bladder with each void and states she woke up at least 10-15 times to use the bathroom to void last night.  She started her menstrual cycle 3 days ago and states this is worsening her suprapubic pressure and pain.  She has had chills without fever during this illness and denies nausea, vomiting, diarrhea, dysuria, flank pain, low back pain, headaches, and dizziness.  Denies vaginal symptoms.  Drinks plenty of water, denies frequent intake of urinary irritants and use of SGLT2 inhibitor.  She is not diabetic.  She has not attempted use of any over-the-counter medications to help with symptoms PTA.   Urinary Frequency    Past Medical History:  Diagnosis Date   ADD (attention deficit disorder)    GERD (gastroesophageal reflux disease)     Patient Active Problem List   Diagnosis Date Noted   Preventative health care 05/22/2021   PMDD  (premenstrual dysphoric disorder) 11/14/2020   Postpartum depression 12/20/2017   Acute pharyngitis 07/30/2016   General medical examination 04/20/2011   Sinusitis 03/22/2011   GERD 12/29/2007   Attention deficit disorder 07/15/2006    History reviewed. No pertinent surgical history.  OB History   No obstetric history on file.      Home Medications    Prior to Admission medications   Medication Sig Start Date End Date Taking? Authorizing Provider  escitalopram  (LEXAPRO ) 10 MG tablet Take 1 tablet (10 mg total) by mouth daily. 09/05/23   Antonio Cyndee Rockers R, DO  lisdexamfetamine (VYVANSE ) 20 MG capsule Take 1-2 capsules (20-40 mg total) by mouth daily. 01/09/24   Antonio Cyndee Rockers JONELLE, DO  nitrofurantoin , macrocrystal-monohydrate, (MACROBID ) 100 MG capsule Take 1 capsule (100 mg total) by mouth 2 (two) times daily. 01/16/24   Vivienne Delon HERO, PA-C  oseltamivir  (TAMIFLU ) 75 MG capsule Take 1 capsule (75 mg total) by mouth daily. Patient not taking: Reported on 01/20/2024 04/25/23   Antonio Cyndee, Rockers JONELLE, DO  valACYclovir  (VALTREX ) 500 MG tablet Take 1 tablet (500 mg total) by mouth daily. 08/17/22   Antonio Cyndee Rockers JONELLE, DO    Family History Family History  Problem Relation Age of Onset   Crohn's disease Mother    Hyperlipidemia Mother    Alzheimer's disease Maternal Grandmother    Heart attack Maternal Grandfather    Breast cancer Paternal Grandmother    Lung cancer Paternal Grandfather  smoker   COPD Paternal Grandfather     Social History Social History   Tobacco Use   Smoking status: Never   Smokeless tobacco: Never  Substance Use Topics   Alcohol use: Yes    Alcohol/week: 3.0 standard drinks of alcohol    Types: 3 Glasses of wine per week   Drug use: No     Allergies   Patient has no known allergies.   Review of Systems Review of Systems  Genitourinary:  Positive for frequency.  Per HPI   Physical Exam Triage Vital Signs ED Triage  Vitals  Encounter Vitals Group     BP 01/20/24 1053 131/79     Girls Systolic BP Percentile --      Girls Diastolic BP Percentile --      Boys Systolic BP Percentile --      Boys Diastolic BP Percentile --      Pulse Rate 01/20/24 1053 86     Resp 01/20/24 1053 12     Temp 01/20/24 1053 97.9 F (36.6 C)     Temp Source 01/20/24 1053 Oral     SpO2 01/20/24 1053 99 %     Weight 01/20/24 1053 130 lb (59 kg)     Height 01/20/24 1053 5' 4 (1.626 m)     Head Circumference --      Peak Flow --      Pain Score 01/20/24 1057 5     Pain Loc --      Pain Education --      Exclude from Growth Chart --    No data found.  Updated Vital Signs BP 131/79 (BP Location: Right Arm)   Pulse 86   Temp 97.9 F (36.6 C) (Oral)   Resp 12   Ht 5' 4 (1.626 m)   Wt 130 lb (59 kg)   LMP 01/17/2024 (Approximate)   SpO2 99%   BMI 22.31 kg/m   Visual Acuity Right Eye Distance:   Left Eye Distance:   Bilateral Distance:    Right Eye Near:   Left Eye Near:    Bilateral Near:     Physical Exam Vitals and nursing note reviewed.  Constitutional:      Appearance: She is not ill-appearing or toxic-appearing.  HENT:     Head: Normocephalic and atraumatic.     Right Ear: Hearing and external ear normal.     Left Ear: Hearing and external ear normal.     Nose: Nose normal.     Mouth/Throat:     Lips: Pink.     Mouth: Mucous membranes are moist.     Pharynx: No posterior oropharyngeal erythema.  Eyes:     General: Lids are normal. Vision grossly intact. Gaze aligned appropriately.     Extraocular Movements: Extraocular movements intact.     Conjunctiva/sclera: Conjunctivae normal.  Pulmonary:     Effort: Pulmonary effort is normal.  Abdominal:     General: Abdomen is flat. Bowel sounds are normal.     Palpations: Abdomen is soft.     Tenderness: There is abdominal tenderness. There is no right CVA tenderness, left CVA tenderness or guarding.  Musculoskeletal:     Cervical back: Neck  supple.  Skin:    General: Skin is warm and dry.     Capillary Refill: Capillary refill takes less than 2 seconds.     Findings: No rash.  Neurological:     General: No focal deficit present.     Mental Status:  She is alert and oriented to person, place, and time. Mental status is at baseline.     Cranial Nerves: No dysarthria or facial asymmetry.  Psychiatric:        Mood and Affect: Mood normal.        Speech: Speech normal.        Behavior: Behavior normal.        Thought Content: Thought content normal.        Judgment: Judgment normal.      UC Treatments / Results  Labs (all labs ordered are listed, but only abnormal results are displayed) Labs Reviewed  POCT URINE DIPSTICK - Abnormal; Notable for the following components:      Result Value   Clarity, UA cloudy (*)    Ketones, POC UA moderate (40) (*)    Spec Grav, UA >=1.030 (*)    Blood, UA moderate (*)    All other components within normal limits  URINALYSIS, W/ REFLEX TO CULTURE (INFECTION SUSPECTED)  POCT URINE PREGNANCY    EKG   Radiology No results found.  Procedures Procedures (including critical care time)  Medications Ordered in UC Medications - No data to display  Initial Impression / Assessment and Plan / UC Course  I have reviewed the triage vital signs and the nursing notes.  Pertinent labs & imaging results that were available during my care of the patient were reviewed by me and considered in my medical decision making (see chart for details).   1.  Urinary frequency, suprapubic pressure Urinalysis shows cloudy urine with elevated specific gravity and moderate blood likely secondary to her menstrual cycle.  Moderate ketones present in the urine.  Urine culture is pending to investigate for persistent UTI with urinary pathogen that is not covered by Macrobid . In the meantime, encouraged to increase fluid intake to stay well-hydrated and avoid urinary irritants. Ibuprofen  600 mg every 6 hours  as needed for pelvic/pubic pressure.  Suspect suprapubic pain is worsened by uterine and menstrual cramping.  Heat as needed. Follow-up with PCP. Patient left prior to receiving AVS stating she will see her results and information on MyChart.  Counseled patient on potential for adverse effects with medications prescribed/recommended today, strict ER and return-to-clinic precautions discussed, patient verbalized understanding.    Final Clinical Impressions(s) / UC Diagnoses   Final diagnoses:  Urinary frequency  Suprapubic pressure   Discharge Instructions   None    ED Prescriptions   None    PDMP not reviewed this encounter.   Enedelia Dorna HERO, [place] 01/20/24 1317

## 2024-01-20 NOTE — ED Triage Notes (Signed)
 Pt did E-visit on 11/17 and was prescribed abx. They helped initally but she is still feeling urinary discomfort, abdominal pressure, and urinary urgency.   She also c/o pain and pressure in left ear that comes and goes for a few months.

## 2024-01-23 ENCOUNTER — Encounter: Admitting: Family Medicine

## 2024-01-23 DIAGNOSIS — Z79899 Other long term (current) drug therapy: Secondary | ICD-10-CM

## 2024-01-23 DIAGNOSIS — F988 Other specified behavioral and emotional disorders with onset usually occurring in childhood and adolescence: Secondary | ICD-10-CM

## 2024-02-10 ENCOUNTER — Other Ambulatory Visit (HOSPITAL_BASED_OUTPATIENT_CLINIC_OR_DEPARTMENT_OTHER): Payer: Self-pay

## 2024-02-10 ENCOUNTER — Other Ambulatory Visit: Payer: Self-pay | Admitting: Family Medicine

## 2024-02-10 DIAGNOSIS — F988 Other specified behavioral and emotional disorders with onset usually occurring in childhood and adolescence: Secondary | ICD-10-CM

## 2024-02-10 MED ORDER — LISDEXAMFETAMINE DIMESYLATE 20 MG PO CAPS
20.0000 mg | ORAL_CAPSULE | Freq: Every day | ORAL | 0 refills | Status: DC
  Filled 2024-02-10: qty 60, 30d supply, fill #0

## 2024-02-10 NOTE — Telephone Encounter (Signed)
 Requesting: Vyvanse  20mg  Contract:06/28/22 UDS:06/28/22 Last Visit:  01/03/23 Next Visit: 02/13/24 Last Refill: 01/09/24 #60 and 0RF   Please Advise

## 2024-02-13 ENCOUNTER — Encounter: Admitting: Family Medicine

## 2024-03-15 ENCOUNTER — Other Ambulatory Visit: Payer: Self-pay | Admitting: Medical Genetics

## 2024-03-15 DIAGNOSIS — Z006 Encounter for examination for normal comparison and control in clinical research program: Secondary | ICD-10-CM

## 2024-03-19 ENCOUNTER — Encounter: Admitting: Family Medicine

## 2024-03-20 ENCOUNTER — Other Ambulatory Visit: Payer: Self-pay | Admitting: Family Medicine

## 2024-03-20 ENCOUNTER — Other Ambulatory Visit (HOSPITAL_BASED_OUTPATIENT_CLINIC_OR_DEPARTMENT_OTHER): Payer: Self-pay

## 2024-03-20 DIAGNOSIS — F988 Other specified behavioral and emotional disorders with onset usually occurring in childhood and adolescence: Secondary | ICD-10-CM

## 2024-03-20 MED ORDER — ESCITALOPRAM OXALATE 10 MG PO TABS
10.0000 mg | ORAL_TABLET | Freq: Every day | ORAL | 1 refills | Status: AC
  Filled 2024-03-20: qty 90, 90d supply, fill #0

## 2024-03-20 MED ORDER — LISDEXAMFETAMINE DIMESYLATE 20 MG PO CAPS
20.0000 mg | ORAL_CAPSULE | Freq: Every day | ORAL | 0 refills | Status: AC
  Filled 2024-03-20: qty 60, 30d supply, fill #0

## 2024-03-20 NOTE — Telephone Encounter (Signed)
 Requesting: vyvanse   Contract:06/28/22 UDS:06/28/22 Last Visit: 01/03/23 Next Visit:03/23/24 Last Refill: see med list   Please Advise

## 2024-03-22 ENCOUNTER — Other Ambulatory Visit: Payer: Self-pay

## 2024-03-23 ENCOUNTER — Encounter: Admitting: Family Medicine

## 2024-04-23 ENCOUNTER — Encounter: Admitting: Family Medicine
